# Patient Record
Sex: Female | Born: 1985 | Race: White | Hispanic: No | Marital: Married | Smoking: Never smoker
Health system: Southern US, Community
[De-identification: ages and names within clinical notes are randomized; demographics above are authoritative.]

## PROBLEM LIST (undated history)

## (undated) DIAGNOSIS — R87629 Unspecified abnormal cytological findings in specimens from vagina: Secondary | ICD-10-CM

## (undated) HISTORY — DX: Unspecified abnormal cytological findings in specimens from vagina: R87.629

---
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
## 2014-11-02 ENCOUNTER — Emergency Department (HOSPITAL_COMMUNITY): Payer: BLUE CROSS/BLUE SHIELD

## 2014-11-02 ENCOUNTER — Encounter (HOSPITAL_COMMUNITY): Payer: Self-pay

## 2014-11-02 ENCOUNTER — Emergency Department (HOSPITAL_COMMUNITY)
Admission: EM | Admit: 2014-11-02 | Discharge: 2014-11-02 | Disposition: A | Discharge status: Home / Self Care | Payer: BLUE CROSS/BLUE SHIELD | Attending: Emergency Medicine | Admitting: Emergency Medicine

## 2014-11-02 DIAGNOSIS — R42 Dizziness and giddiness: Secondary | ICD-10-CM | POA: Diagnosis present

## 2014-11-02 DIAGNOSIS — Z3202 Encounter for pregnancy test, result negative: Secondary | ICD-10-CM | POA: Diagnosis not present

## 2014-11-02 DIAGNOSIS — H538 Other visual disturbances: Secondary | ICD-10-CM | POA: Insufficient documentation

## 2014-11-02 DIAGNOSIS — R519 Headache, unspecified: Secondary | ICD-10-CM

## 2014-11-02 DIAGNOSIS — R51 Headache: Secondary | ICD-10-CM | POA: Diagnosis not present

## 2014-11-02 LAB — I-STAT BETA HCG BLOOD, ED (MC, WL, AP ONLY): I-stat hCG, quantitative: <5 m[IU]/mL (ref <5)

## 2014-11-02 LAB — URINE MICROSCOPIC-ADD ON

## 2014-11-02 LAB — URINALYSIS, ROUTINE W REFLEX MICROSCOPIC
Bilirubin Urine: NEGATIVE
Glucose, UA: NEGATIVE mg/dL
Hgb urine dipstick: NEGATIVE
Ketones, ur: NEGATIVE mg/dL
Nitrite: NEGATIVE
Protein, ur: NEGATIVE mg/dL
Specific Gravity, Urine: 1.003 — ABNORMAL LOW (ref 1.005–1.030)
Urobilinogen, UA: 0.2 mg/dL (ref 0.0–1.0)
pH: 6.5 (ref 5.0–8.0)

## 2014-11-02 LAB — BASIC METABOLIC PANEL
Anion gap: 8 (ref 5–15)
BUN: 10 mg/dL (ref 6–20)
CO2: 23 mmol/L (ref 22–32)
Calcium: 9.4 mg/dL (ref 8.9–10.3)
Chloride: 106 mmol/L (ref 101–111)
Creatinine, Ser: 0.71 mg/dL (ref 0.44–1.00)
GFR calc Af Amer: >60 mL/min (ref >60)
GFR calc non Af Amer: >60 mL/min (ref >60)
Glucose, Bld: 102 mg/dL — ABNORMAL HIGH (ref 65–99)
Potassium: 3.9 mmol/L (ref 3.5–5.1)
Sodium: 137 mmol/L (ref 135–145)

## 2014-11-02 LAB — CBC WITH DIFFERENTIAL/PLATELET
Basophils Absolute: 0 10*3/uL (ref 0.0–0.1)
Basophils Relative: 0 %
Eosinophils Absolute: 0.1 10*3/uL (ref 0.0–0.7)
Eosinophils Relative: 1 %
HCT: 41.8 % (ref 36.0–46.0)
Hemoglobin: 14.6 g/dL (ref 12.0–15.0)
Lymphocytes Relative: 18 %
Lymphs Abs: 1.5 10*3/uL (ref 0.7–4.0)
MCH: 32.4 pg (ref 26.0–34.0)
MCHC: 34.9 g/dL (ref 30.0–36.0)
MCV: 92.7 fL (ref 78.0–100.0)
Monocytes Absolute: 0.4 10*3/uL (ref 0.1–1.0)
Monocytes Relative: 5 %
Neutro Abs: 6.4 10*3/uL (ref 1.7–7.7)
Neutrophils Relative %: 76 %
Platelets: 209 10*3/uL (ref 150–400)
RBC: 4.51 MIL/uL (ref 3.87–5.11)
RDW: 11.9 % (ref 11.5–15.5)
WBC: 8.4 10*3/uL (ref 4.0–10.5)

## 2014-11-02 NOTE — Discharge Instructions (Signed)
Headaches, Frequently Asked Questions  MIGRAINE HEADACHES  Q: What is migraine? What causes it? How can I treat it?  A: Generally, migraine headaches begin as a dull ache. Then they develop into a constant, throbbing, and pulsating pain. You may experience pain at the temples. You may experience pain at the front or back of one or both sides of the head. The pain is usually accompanied by a combination of:  · Nausea.  · Vomiting.  · Sensitivity to light and noise.  Some people (about 15%) experience an aura (see below) before an attack. The cause of migraine is believed to be chemical reactions in the brain. Treatment for migraine may include over-the-counter or prescription medications. It may also include self-help techniques. These include relaxation training and biofeedback.   Q: What is an aura?  A: About 15% of people with migraine get an "aura". This is a sign of neurological symptoms that occur before a migraine headache. You may see wavy or jagged lines, dots, or flashing lights. You might experience tunnel vision or blind spots in one or both eyes. The aura can include visual or auditory hallucinations (something imagined). It may include disruptions in smell (such as strange odors), taste or touch. Other symptoms include:  · Numbness.  · A "pins and needles" sensation.  · Difficulty in recalling or speaking the correct word.  These neurological events may last as long as 60 minutes. These symptoms will fade as the headache begins.  Q: What is a trigger?  A: Certain physical or environmental factors can lead to or "trigger" a migraine. These include:  · Foods.  · Hormonal changes.  · Weather.  · Stress.  It is important to remember that triggers are different for everyone. To help prevent migraine attacks, you need to figure out which triggers affect you. Keep a headache diary. This is a good way to track triggers. The diary will help you talk to your healthcare professional about your condition.  Q: Does  weather affect migraines?  A: Bright sunshine, hot, humid conditions, and drastic changes in barometric pressure may lead to, or "trigger," a migraine attack in some people. But studies have shown that weather does not act as a trigger for everyone with migraines.  Q: What is the link between migraine and hormones?  A: Hormones start and regulate many of your body's functions. Hormones keep your body in balance within a constantly changing environment. The levels of hormones in your body are unbalanced at times. Examples are during menstruation, pregnancy, or menopause. That can lead to a migraine attack. In fact, about three quarters of all women with migraine report that their attacks are related to the menstrual cycle.   Q: Is there an increased risk of stroke for migraine sufferers?  A: The likelihood of a migraine attack causing a stroke is very remote. That is not to say that migraine sufferers cannot have a stroke associated with their migraines. In persons under age 40, the most common associated factor for stroke is migraine headache. But over the course of a person's normal life span, the occurrence of migraine headache may actually be associated with a reduced risk of dying from cerebrovascular disease due to stroke.   Q: What are acute medications for migraine?  A: Acute medications are used to treat the pain of the headache after it has started. Examples over-the-counter medications, NSAIDs, ergots, and triptans.   Q: What are the triptans?  A: Triptans are the newest class of   abortive medications. They are specifically targeted to treat migraine. Triptans are vasoconstrictors. They moderate some chemical reactions in the brain. The triptans work on receptors in your brain. Triptans help to restore the balance of a neurotransmitter called serotonin. Fluctuations in levels of serotonin are thought to be a main cause of migraine.   Q: Are over-the-counter medications for migraine effective?  A:  Over-the-counter, or "OTC," medications may be effective in relieving mild to moderate pain and associated symptoms of migraine. But you should see your caregiver before beginning any treatment regimen for migraine.   Q: What are preventive medications for migraine?  A: Preventive medications for migraine are sometimes referred to as "prophylactic" treatments. They are used to reduce the frequency, severity, and length of migraine attacks. Examples of preventive medications include antiepileptic medications, antidepressants, beta-blockers, calcium channel blockers, and NSAIDs (nonsteroidal anti-inflammatory drugs).  Q: Why are anticonvulsants used to treat migraine?  A: During the past few years, there has been an increased interest in antiepileptic drugs for the prevention of migraine. They are sometimes referred to as "anticonvulsants". Both epilepsy and migraine may be caused by similar reactions in the brain.   Q: Why are antidepressants used to treat migraine?  A: Antidepressants are typically used to treat people with depression. They may reduce migraine frequency by regulating chemical levels, such as serotonin, in the brain.   Q: What alternative therapies are used to treat migraine?  A: The term "alternative therapies" is often used to describe treatments considered outside the scope of conventional Western medicine. Examples of alternative therapy include acupuncture, acupressure, and yoga. Another common alternative treatment is herbal therapy. Some herbs are believed to relieve headache pain. Always discuss alternative therapies with your caregiver before proceeding. Some herbal products contain arsenic and other toxins.  TENSION HEADACHES  Q: What is a tension-type headache? What causes it? How can I treat it?  A: Tension-type headaches occur randomly. They are often the result of temporary stress, anxiety, fatigue, or anger. Symptoms include soreness in your temples, a tightening band-like sensation  around your head (a "vice-like" ache). Symptoms can also include a pulling feeling, pressure sensations, and contracting head and neck muscles. The headache begins in your forehead, temples, or the back of your head and neck. Treatment for tension-type headache may include over-the-counter or prescription medications. Treatment may also include self-help techniques such as relaxation training and biofeedback.  CLUSTER HEADACHES  Q: What is a cluster headache? What causes it? How can I treat it?  A: Cluster headache gets its name because the attacks come in groups. The pain arrives with little, if any, warning. It is usually on one side of the head. A tearing or bloodshot eye and a runny nose on the same side of the headache may also accompany the pain. Cluster headaches are believed to be caused by chemical reactions in the brain. They have been described as the most severe and intense of any headache type. Treatment for cluster headache includes prescription medication and oxygen.  SINUS HEADACHES  Q: What is a sinus headache? What causes it? How can I treat it?  A: When a cavity in the bones of the face and skull (a sinus) becomes inflamed, the inflammation will cause localized pain. This condition is usually the result of an allergic reaction, a tumor, or an infection. If your headache is caused by a sinus blockage, such as an infection, you will probably have a fever. An x-ray will confirm a sinus blockage. Your caregiver's   treatment might include antibiotics for the infection, as well as antihistamines or decongestants.   REBOUND HEADACHES  Q: What is a rebound headache? What causes it? How can I treat it?  A: A pattern of taking acute headache medications too often can lead to a condition known as "rebound headache." A pattern of taking too much headache medication includes taking it more than 2 days per week or in excessive amounts. That means more than the label or a caregiver advises. With rebound  headaches, your medications not only stop relieving pain, they actually begin to cause headaches. Doctors treat rebound headache by tapering the medication that is being overused. Sometimes your caregiver will gradually substitute a different type of treatment or medication. Stopping may be a challenge. Regularly overusing a medication increases the potential for serious side effects. Consult a caregiver if you regularly use headache medications more than 2 days per week or more than the label advises.  ADDITIONAL QUESTIONS AND ANSWERS  Q: What is biofeedback?  A: Biofeedback is a self-help treatment. Biofeedback uses special equipment to monitor your body's involuntary physical responses. Biofeedback monitors:  · Breathing.  · Pulse.  · Heart rate.  · Temperature.  · Muscle tension.  · Brain activity.  Biofeedback helps you refine and perfect your relaxation exercises. You learn to control the physical responses that are related to stress. Once the technique has been mastered, you do not need the equipment any more.  Q: Are headaches hereditary?  A: Four out of five (80%) of people that suffer report a family history of migraine. Scientists are not sure if this is genetic or a family predisposition. Despite the uncertainty, a child has a 50% chance of having migraine if one parent suffers. The child has a 75% chance if both parents suffer.   Q: Can children get headaches?  A: By the time they reach high school, most young people have experienced some type of headache. Many safe and effective approaches or medications can prevent a headache from occurring or stop it after it has begun.   Q: What type of doctor should I see to diagnose and treat my headache?  A: Start with your primary caregiver. Discuss his or her experience and approach to headaches. Discuss methods of classification, diagnosis, and treatment. Your caregiver may decide to recommend you to a headache specialist, depending upon your symptoms or other  physical conditions. Having diabetes, allergies, etc., may require a more comprehensive and inclusive approach to your headache. The National [employer] will provide, upon request, a list of NHF physician members in your state.  Document Released: 04/27/2003 Document Revised: 04/29/2011 Document Reviewed: 10/05/2007  ExitCare® Patient Information ©2015 [employer]. This information is not intended to replace advice given to you by your health care provider. Make sure you discuss any questions you have with your health care provider.      Migraine Headache  A migraine headache is very bad, throbbing pain on one or both sides of your head. Talk to your doctor about what things may bring on (trigger) your migraine headaches.  HOME CARE  · Only take medicines as told by your doctor.  · Lie down in a dark, quiet room when you have a migraine.  · Keep a journal to find out if certain things bring on migraine headaches. For example, write down:  ¨ What you eat and drink.  ¨ How much sleep you get.  ¨ Any change to your diet or medicines.  ·   Lessen how much alcohol you drink.  · Quit smoking if you smoke.  · Get enough sleep.  · Lessen any stress in your life.  · Keep lights dim if bright lights bother you or make your migraines worse.  GET HELP RIGHT AWAY IF:   · Your migraine becomes really bad.  · You have a fever.  · You have a stiff neck.  · You have trouble seeing.  · Your muscles are weak, or you lose muscle control.  · You lose your balance or have trouble walking.  · You feel like you will pass out (faint), or you pass out.  · You have really bad symptoms that are different than your first symptoms.  MAKE SURE YOU:   · Understand these instructions.  · Will watch your condition.  · Will get help right away if you are not doing well or get worse.  Document Released: 11/14/2007 Document Revised: 04/29/2011 Document Reviewed: 10/12/2012  ExitCare® Patient Information ©2015 [employer]. This information is  not intended to replace advice given to you by your health care provider. Make sure you discuss any questions you have with your health care provider.

## 2014-11-02 NOTE — ED Provider Notes (Signed)
CSN: [id number]     Arrival date & time 11/02/14  4010 History   First MD Initiated Contact with Patient 11/02/14 [phone]     Chief Complaint  Patient presents with  . Dizziness  . Blurred Vision     (Consider location/radiation/quality/duration/timing/severity/associated sxs/prior Treatment) HPI Comments: Pt comes in with c/o vision abnormality in her right eye that lasted a couple of minutes. She states that she had some fussiness in the center or her vision and then she saw some flashing light. She states that the whole time she could she it was just abnormal. She took out her contact. The symptoms resolved on there own. She was seen by the nurse at work and they told her to come to the er. She states that she is also having some bilateral leg heaviness. Denies slurred speech, confusion. Admits to headache. No recent injury. Hasn't taken any medication  The history is provided by the patient. No language interpreter was used.    History reviewed. No pertinent past medical history. History reviewed. No pertinent past surgical history. History reviewed. No pertinent family history. Social History  Substance Use Topics  . Smoking status: Never Smoker   . Smokeless tobacco: None  . Alcohol Use: Yes     Comment: Social   OB History    No data available     Review of Systems  All other systems reviewed and are negative.     Allergies  Hydrocodone-acetaminophen  Home Medications   Prior to Admission medications   Not on File   BP 144/87 mmHg  Pulse 108  Temp(Src) 98.4 F (36.9 C) (Oral)  Resp 16  SpO2 100%  LMP 10/18/2014 Physical Exam  Constitutional: She is oriented to person, place, and time. She appears well-developed and well-nourished.  HENT:  Head: Normocephalic and atraumatic.  Right Ear: External ear normal.  Left Ear: External ear normal.  Eyes: Conjunctivae and EOM are normal. Pupils are equal, round, and reactive to light.  Neck: Normal range of motion.  Neck supple.  Cardiovascular: Normal rate and regular rhythm.   Pulmonary/Chest: Effort normal and breath sounds normal.  Abdominal: Soft. There is no tenderness.  Musculoskeletal: Normal range of motion.  Neurological: She is alert and oriented to person, place, and time. She exhibits normal muscle tone. Coordination normal.  Pt not negative finger to nose. No pronator drift. Ambulatory without any problem  Skin: Skin is warm and dry.  Psychiatric: She has a normal mood and affect.  Nursing note and vitals reviewed.   ED Course  Procedures (including critical care time) Labs Review Labs Reviewed  BASIC METABOLIC PANEL - Abnormal; Notable for the following:    Glucose, Bld 102 (*)    All other components within normal limits  URINALYSIS, ROUTINE W REFLEX MICROSCOPIC (NOT AT [facility]) - Abnormal; Notable for the following:    Color, Urine STRAW (*)    Specific Gravity, Urine 1.003 (*)    Leukocytes, UA TRACE (*)    All other components within normal limits  URINE MICROSCOPIC-ADD ON - Abnormal; Notable for the following:    Bacteria, UA FEW (*)    All other components within normal limits  CBC WITH DIFFERENTIAL/PLATELET  I-STAT BETA HCG BLOOD, ED ([facility], [facility], [facility] ONLY)    Imaging Review Ct Head Wo Contrast  11/02/2014   CLINICAL DATA:  Sudden onset of blurred vision today with headache. Initial encounter.  EXAM: CT HEAD WITHOUT CONTRAST  TECHNIQUE: Contiguous axial images were obtained from the  base of the skull through the vertex without intravenous contrast.  COMPARISON:  None.  FINDINGS: There is no evidence of acute intracranial hemorrhage, mass lesion, brain edema or extra-axial fluid collection. The ventricles and subarachnoid spaces are appropriately sized for age. There is no CT evidence of acute cortical infarction.  The visualized paranasal sinuses, mastoid air cells and middle ears are clear. The calvarium is intact.  IMPRESSION: Unremarkable noncontrast head CT.   Electronically  Signed   By: Carey Bullocks M.D.   On: 11/02/2014 10:58   I have personally reviewed and evaluated these images and lab results as part of my medical decision-making.   EKG Interpretation None      MDM   Final diagnoses:  Headache, unspecified headache type    Symptoms have continued to be resolved minus the headache. Pt is refusing medications. She states that she will just take medications when she gets some. Discussed return precautions with pt.     Teressa Lower, NP 11/02/14 1155  Laurence Spates, MD 11/02/14 1233

## 2014-11-02 NOTE — ED Notes (Signed)
PA at bedside.

## 2014-11-02 NOTE — ED Notes (Signed)
Pt reports she initially noticed blurry vision when she left her house for work this morning. Pt reports she felt a weakness bilaterally in her legs while walking. Pt is alert and oriented X4. Pt denies slight headache.

## 2018-07-30 LAB — OB RESULTS CONSOLE HIV ANTIBODY (ROUTINE TESTING): HIV: NONREACTIVE

## 2018-07-30 LAB — OB RESULTS CONSOLE VARICELLA ZOSTER ANTIBODY, IGG: Varicella: IMMUNE

## 2018-07-30 LAB — OB RESULTS CONSOLE GC/CHLAMYDIA
Chlamydia: NEGATIVE
Gonorrhea: NEGATIVE

## 2018-07-30 LAB — OB RESULTS CONSOLE HEPATITIS B SURFACE ANTIGEN: Hepatitis B Surface Ag: NEGATIVE

## 2018-07-30 LAB — OB RESULTS CONSOLE RUBELLA ANTIBODY, IGM: Rubella: NON-IMMUNE/NOT IMMUNE

## 2019-02-02 LAB — OB RESULTS CONSOLE GBS: GBS: NEGATIVE

## 2019-02-19 NOTE — L&D Delivery Note (Signed)
Delivery Note:   G1P0 at [redacted]w[redacted]d  Admitting diagnosis: Pregnancy [Z34.90] Risks: late term Onset of labor: 2230 03/06/2019 Augmentation: AROM forebag ROM: 0841 03/07/2019  Complete dilation at 03/07/2019  1446 Onset of pushing at 1446 FHR second stage Cat 1 and 2, variables  Analgesia /Anesthesia intrapartum:Epidural  Pushing in L and R lateral tilt position with CNM and L&D staff support, FOB Ree Kida present for birth and supportive.  Delivery of a Live born female  Birth Weight:  pending APGAR: 9, 9  Newborn Delivery   Birth date/time: 03/07/2019 16:10:00 Delivery type: Vaginal, Spontaneous      in cephalic presentation, position OA to LOT. Body assisted by FOB and placed on mother's chest. Strong cry at delivery, moderate meconium and terminal meconium noted.   Nuchal Cord: No  Cord double clamped after cessation of pulsation, cut by FOB.  Collection of cord blood for typing completed. Cord blood donation-None  Arterial cord blood sample-No    Placenta delivered-Spontaneous  with 3 vessels . Trailing membranes. Uterotonics: Pitocin Placenta to L&D for disposal, heavy mec stain not recommended for encapsulation. Marland Kitchen Uterine tone firm, bleeding small  1st degree  laceration identified.  Episiotomy:None  Local analgesia: none  Repair: 3.0 vicryl in standard fashion Est. Blood Loss (mL):200.00   Complications: None  Mom to postpartum.  Baby Duke to Couplet care / Skin to Skin.  Delivery Report:  Review the Delivery Report for details.     Signed: Neta Mends, CNM, MSN 03/07/2019, 4:37 PM

## 2019-02-26 ENCOUNTER — Inpatient Hospital Stay (HOSPITAL_COMMUNITY): Admission: AD | Admit: 2019-02-26 | Payer: 59 | Source: Home / Self Care | Admitting: Obstetrics and Gynecology

## 2019-03-07 ENCOUNTER — Inpatient Hospital Stay (HOSPITAL_COMMUNITY): Payer: No Typology Code available for payment source | Admitting: Anesthesiology

## 2019-03-07 ENCOUNTER — Inpatient Hospital Stay (HOSPITAL_COMMUNITY)
Admission: AD | Admit: 2019-03-07 | Discharge: 2019-03-09 | DRG: 806 | Disposition: A | Discharge status: Home / Self Care | Payer: No Typology Code available for payment source

## 2019-03-07 ENCOUNTER — Other Ambulatory Visit: Payer: Self-pay

## 2019-03-07 ENCOUNTER — Encounter (HOSPITAL_COMMUNITY): Payer: Self-pay

## 2019-03-07 DIAGNOSIS — Z20822 Contact with and (suspected) exposure to covid-19: Secondary | ICD-10-CM | POA: Diagnosis present

## 2019-03-07 DIAGNOSIS — Z3490 Encounter for supervision of normal pregnancy, unspecified, unspecified trimester: Secondary | ICD-10-CM

## 2019-03-07 DIAGNOSIS — O480 Post-term pregnancy: Secondary | ICD-10-CM | POA: Diagnosis present

## 2019-03-07 DIAGNOSIS — D62 Acute posthemorrhagic anemia: Secondary | ICD-10-CM | POA: Diagnosis not present

## 2019-03-07 DIAGNOSIS — O9081 Anemia of the puerperium: Secondary | ICD-10-CM | POA: Diagnosis not present

## 2019-03-07 DIAGNOSIS — Z3A41 41 weeks gestation of pregnancy: Secondary | ICD-10-CM | POA: Diagnosis not present

## 2019-03-07 LAB — RESPIRATORY PANEL BY RT PCR (FLU A&B, COVID)
Influenza A by PCR: NEGATIVE
Influenza B by PCR: NEGATIVE
SARS Coronavirus 2 by RT PCR: NEGATIVE

## 2019-03-07 LAB — CBC
HCT: 35 % — ABNORMAL LOW (ref 36.0–46.0)
Hemoglobin: 12.1 g/dL (ref 12.0–15.0)
MCH: 31.9 pg (ref 26.0–34.0)
MCHC: 34.6 g/dL (ref 30.0–36.0)
MCV: 92.3 fL (ref 80.0–100.0)
Platelets: 181 K/uL (ref 150–400)
RBC: 3.79 MIL/uL — ABNORMAL LOW (ref 3.87–5.11)
RDW: 12.9 % (ref 11.5–15.5)
WBC: 14 K/uL — ABNORMAL HIGH (ref 4.0–10.5)
nRBC: 0 % (ref 0.0–0.2)

## 2019-03-07 LAB — RPR: RPR Ser Ql: NONREACTIVE

## 2019-03-07 LAB — TYPE AND SCREEN
ABO/RH(D): A POS
Antibody Screen: NEGATIVE

## 2019-03-07 LAB — ABO/RH: ABO/RH(D): A POS

## 2019-03-07 MED ORDER — OXYTOCIN 40 UNITS IN NORMAL SALINE INFUSION - SIMPLE MED
INTRAVENOUS | Status: AC
  Filled 2019-03-07: qty 1000

## 2019-03-07 MED ORDER — DIBUCAINE (PERIANAL) 1 % EX OINT: 1.0000 "application " | TOPICAL_OINTMENT | CUTANEOUS | Status: DC | PRN

## 2019-03-07 MED ORDER — SOD CITRATE-CITRIC ACID 500-334 MG/5ML PO SOLN
30.0000 mL | ORAL | Status: DC | PRN
  Administered 2019-03-07 (×2): 30 mL via ORAL
  Filled 2019-03-07 (×2): qty 30

## 2019-03-07 MED ORDER — BENZOCAINE-MENTHOL 20-0.5 % EX AERO
1.0000 "application " | INHALATION_SPRAY | CUTANEOUS | Status: DC | PRN
  Filled 2019-03-07: qty 56

## 2019-03-07 MED ORDER — PHENYLEPHRINE 40 MCG/ML (10ML) SYRINGE FOR IV PUSH (FOR BLOOD PRESSURE SUPPORT)
80.0000 ug | PREFILLED_SYRINGE | INTRAVENOUS | Status: DC | PRN
  Filled 2019-03-07: qty 10

## 2019-03-07 MED ORDER — FLEET ENEMA 7-19 GM/118ML RE ENEM: 1.0000 | ENEMA | Freq: Every day | RECTAL | Status: DC | PRN

## 2019-03-07 MED ORDER — LACTATED RINGERS IV SOLN: 500.0000 mL | Freq: Once | INTRAVENOUS | Status: DC

## 2019-03-07 MED ORDER — FENTANYL-BUPIVACAINE-NACL 0.5-0.125-0.9 MG/250ML-% EP SOLN
12.0000 mL/h | EPIDURAL | Status: DC | PRN
  Filled 2019-03-07: qty 250

## 2019-03-07 MED ORDER — LIDOCAINE HCL (PF) 1 % IJ SOLN
INTRAMUSCULAR | Status: DC | PRN
  Administered 2019-03-07 (×2): 4 mL via EPIDURAL

## 2019-03-07 MED ORDER — ZOLPIDEM TARTRATE 5 MG PO TABS: 5.0000 mg | ORAL_TABLET | Freq: Every evening | ORAL | Status: DC | PRN

## 2019-03-07 MED ORDER — LIDOCAINE HCL (PF) 1 % IJ SOLN: 30.0000 mL | INTRAMUSCULAR | Status: DC | PRN

## 2019-03-07 MED ORDER — EPHEDRINE 5 MG/ML INJ: 10.0000 mg | INTRAVENOUS | Status: DC | PRN

## 2019-03-07 MED ORDER — SENNOSIDES-DOCUSATE SODIUM 8.6-50 MG PO TABS
2.0000 | ORAL_TABLET | ORAL | Status: DC
  Filled 2019-03-07: qty 2

## 2019-03-07 MED ORDER — PHENYLEPHRINE 40 MCG/ML (10ML) SYRINGE FOR IV PUSH (FOR BLOOD PRESSURE SUPPORT)
80.0000 ug | PREFILLED_SYRINGE | INTRAVENOUS | Status: DC | PRN
  Administered 2019-03-07: 80 ug via INTRAVENOUS

## 2019-03-07 MED ORDER — LACTATED RINGERS IV SOLN
500.0000 mL | Freq: Once | INTRAVENOUS | Status: AC
  Administered 2019-03-07: 1000 mL via INTRAVENOUS

## 2019-03-07 MED ORDER — DIPHENHYDRAMINE HCL 50 MG/ML IJ SOLN: 12.5000 mg | INTRAMUSCULAR | Status: DC | PRN

## 2019-03-07 MED ORDER — ONDANSETRON HCL 4 MG/2ML IJ SOLN: 4.0000 mg | Freq: Four times a day (QID) | INTRAMUSCULAR | Status: DC | PRN

## 2019-03-07 MED ORDER — PRENATAL MULTIVITAMIN CH
1.0000 | ORAL_TABLET | Freq: Every day | ORAL | Status: DC
  Administered 2019-03-08 – 2019-03-09 (×2): 1 via ORAL
  Filled 2019-03-07 (×2): qty 1

## 2019-03-07 MED ORDER — SIMETHICONE 80 MG PO CHEW: 80.0000 mg | CHEWABLE_TABLET | ORAL | Status: DC | PRN

## 2019-03-07 MED ORDER — BISACODYL 10 MG RE SUPP: 10.0000 mg | Freq: Every day | RECTAL | Status: DC | PRN

## 2019-03-07 MED ORDER — TETANUS-DIPHTH-ACELL PERTUSSIS 5-2.5-18.5 LF-MCG/0.5 IM SUSP: 0.5000 mL | Freq: Once | INTRAMUSCULAR | Status: DC

## 2019-03-07 MED ORDER — SODIUM CHLORIDE (PF) 0.9 % IJ SOLN
INTRAMUSCULAR | Status: DC | PRN
  Administered 2019-03-07: 12 mL/h via EPIDURAL

## 2019-03-07 MED ORDER — ACETAMINOPHEN 325 MG PO TABS: 650.0000 mg | ORAL_TABLET | ORAL | Status: DC | PRN

## 2019-03-07 MED ORDER — DIPHENHYDRAMINE HCL 25 MG PO CAPS: 25.0000 mg | ORAL_CAPSULE | Freq: Four times a day (QID) | ORAL | Status: DC | PRN

## 2019-03-07 MED ORDER — ONDANSETRON HCL 4 MG PO TABS: 4.0000 mg | ORAL_TABLET | ORAL | Status: DC | PRN

## 2019-03-07 MED ORDER — IBUPROFEN 600 MG PO TABS
600.0000 mg | ORAL_TABLET | Freq: Four times a day (QID) | ORAL | Status: DC
  Administered 2019-03-07 – 2019-03-09 (×7): 600 mg via ORAL
  Filled 2019-03-07 (×7): qty 1

## 2019-03-07 MED ORDER — ONDANSETRON HCL 4 MG/2ML IJ SOLN: 4.0000 mg | INTRAMUSCULAR | Status: DC | PRN

## 2019-03-07 MED ORDER — COCONUT OIL OIL: 1.0000 "application " | TOPICAL_OIL | Status: DC | PRN

## 2019-03-07 MED ORDER — WITCH HAZEL-GLYCERIN EX PADS: 1.0000 "application " | MEDICATED_PAD | CUTANEOUS | Status: DC | PRN

## 2019-03-07 NOTE — Plan of Care (Signed)
  Problem: Education: Goal: Knowledge of Childbirth will improve 03/07/2019 1735 by Susanne Greenhouse, RN Outcome: Completed/Met 03/07/2019 1129 by Susanne Greenhouse, RN Outcome: Progressing Goal: Ability to make informed decisions regarding treatment and plan of care will improve 03/07/2019 1735 by Susanne Greenhouse, RN Outcome: Completed/Met 03/07/2019 1129 by Susanne Greenhouse, RN Outcome: Progressing Goal: Ability to state and carry out methods to decrease the pain will improve 03/07/2019 1735 by Susanne Greenhouse, RN Outcome: Completed/Met 03/07/2019 1129 by Susanne Greenhouse, RN Outcome: Progressing Goal: Individualized Educational Video(s) 03/07/2019 1735 by Susanne Greenhouse, RN Outcome: Completed/Met 03/07/2019 1129 by Susanne Greenhouse, RN Outcome: Progressing   Problem: Coping: Goal: Ability to verbalize concerns and feelings about labor and delivery will improve 03/07/2019 1735 by Susanne Greenhouse, RN Outcome: Completed/Met 03/07/2019 1129 by Susanne Greenhouse, RN Outcome: Progressing   Problem: Life Cycle: Goal: Ability to make normal progression through stages of labor will improve 03/07/2019 1735 by Susanne Greenhouse, RN Outcome: Completed/Met 03/07/2019 1129 by Susanne Greenhouse, RN Outcome: Progressing Goal: Ability to effectively push during vaginal delivery will improve 03/07/2019 1735 by Susanne Greenhouse, RN Outcome: Completed/Met 03/07/2019 1129 by Susanne Greenhouse, RN Outcome: Progressing   Problem: Role Relationship: Goal: Will demonstrate positive interactions with the child 03/07/2019 1735 by Susanne Greenhouse, RN Outcome: Completed/Met 03/07/2019 1129 by Susanne Greenhouse, RN Outcome: Progressing   Problem: Safety: Goal: Risk of complications during labor and delivery will decrease 03/07/2019 1735 by Susanne Greenhouse, RN Outcome: Completed/Met 03/07/2019 1129 by Susanne Greenhouse, RN Outcome: Progressing   Problem: Pain Management: Goal: Relief or control of pain  from uterine contractions will improve 03/07/2019 1735 by Susanne Greenhouse, RN Outcome: Completed/Met 03/07/2019 1129 by Susanne Greenhouse, RN Outcome: Progressing   Problem: Pain Managment: Goal: General experience of comfort will improve 03/07/2019 1735 by Susanne Greenhouse, RN Outcome: Completed/Met 03/07/2019 1129 by Susanne Greenhouse, RN Outcome: Progressing   Problem: Safety: Goal: Ability to remain free from injury will improve 03/07/2019 1735 by Susanne Greenhouse, RN Outcome: Completed/Met 03/07/2019 1129 by Susanne Greenhouse, RN Outcome: Progressing

## 2019-03-07 NOTE — MAU Note (Signed)
Patient presents to MAU c/o ctx. +FM, denies LOF. Reports bloody show

## 2019-03-07 NOTE — Anesthesia Preprocedure Evaluation (Signed)
Anesthesia Evaluation  Patient identified by MRN, date of birth, ID band Patient awake    Reviewed: Allergy & Precautions, Patient's Chart, lab work & pertinent test results  History of Anesthesia Complications Negative for: history of anesthetic complications  Airway Mallampati: II  TM Distance: >3 FB Neck ROM: Full    Dental no notable dental hx.    Pulmonary neg pulmonary ROS,    Pulmonary exam normal        Cardiovascular negative cardio ROS Normal cardiovascular exam     Neuro/Psych negative neurological ROS  negative psych ROS   GI/Hepatic negative GI ROS, Neg liver ROS,   Endo/Other  negative endocrine ROS  Renal/GU negative Renal ROS  negative genitourinary   Musculoskeletal negative musculoskeletal ROS (+)   Abdominal   Peds  Hematology negative hematology ROS (+)   Anesthesia Other Findings Day of surgery medications reviewed with patient.  Reproductive/Obstetrics (+) Pregnancy                             Anesthesia Physical Anesthesia Plan  ASA: II  Anesthesia Plan: Epidural   Post-op Pain Management:    Induction:   PONV Risk Score and Plan: Treatment may vary due to age or medical condition  Airway Management Planned: Natural Airway  Additional Equipment:   Intra-op Plan:   Post-operative Plan:   Informed Consent: I have reviewed the patients History and Physical, chart, labs and discussed the procedure including the risks, benefits and alternatives for the proposed anesthesia with the patient or authorized representative who has indicated his/her understanding and acceptance.       Plan Discussed with:   Anesthesia Plan Comments:         Anesthesia Quick Evaluation  

## 2019-03-07 NOTE — Plan of Care (Signed)
  Problem: Education: Goal: Knowledge of Childbirth will improve Outcome: Progressing Goal: Ability to make informed decisions regarding treatment and plan of care will improve Outcome: Progressing Goal: Ability to state and carry out methods to decrease the pain will improve Outcome: Progressing Goal: Individualized Educational Video(s) Outcome: Progressing   Problem: Coping: Goal: Ability to verbalize concerns and feelings about labor and delivery will improve Outcome: Progressing   Problem: Life Cycle: Goal: Ability to make normal progression through stages of labor will improve Outcome: Progressing Goal: Ability to effectively push during vaginal delivery will improve Outcome: Progressing   Problem: Role Relationship: Goal: Will demonstrate positive interactions with the child Outcome: Progressing   Problem: Safety: Goal: Risk of complications during labor and delivery will decrease Outcome: Progressing   Problem: Pain Management: Goal: Relief or control of pain from uterine contractions will improve Outcome: Progressing   Problem: Pain Managment: Goal: General experience of comfort will improve Outcome: Progressing   Problem: Safety: Goal: Ability to remain free from injury will improve Outcome: Progressing

## 2019-03-07 NOTE — Progress Notes (Signed)
Patient ID: Amanda Elliott, female   DOB: Jul 01, 1985, 34 y.o.   MRN: [id number] S: Doing well, pain controlled with  Epidural, pelvic pressure intermittent. Opted for epidural after cervix unchanged from 7 cm over several hours.    O: Vitals:   03/07/19 1310 03/07/19 1315 03/07/19 1320 03/07/19 1325  BP: 110/68 120/71 107/62 120/78  Pulse: 100 97 90 96  Resp: 16 16 16 16   Temp:      TempSrc:      Weight:      Height:         FHT:  FHR: 140 bpm, variability: moderate,  accelerations:  Present,  decelerations:  Present prolonged decel right after epidural resolved with fluids bolus and phenylephrine UC:   regular, every 2-3 minutes SVE:   Dilation: Lip/rim Effacement (%): 80 Station: Plus 2 Exam by:: D Artemis Koller CNM  Lip reducible with ctx.  A / P: Protracted active phase Progress after epidural and pelvic relaxation Will continue with position changes / peanut ball Fetal Wellbeing:  Category II  post epidural hypotensive episode, resolved, now Cat 1 with early decels Pain Control:  Epidural  Anticipated MOD:  NSVD  [phone], CNM, MSN 03/07/2019, 1:34 PM

## 2019-03-07 NOTE — H&P (Signed)
OB ADMISSION/ HISTORY & PHYSICAL:  Admission Date: 03/07/2019  3:11 AM  Admit Diagnosis: Pregnancy [Z34.90]    Amanda Elliott is a 34 y.o. female presenting for painful ctx started last evening. Denies LOF, reports + spotting, + FM, denies PEC sx. Desires natural labor, using doula and spouse for support.  Prenatal History: G1P0   EDC : 02/26/2019, by Last Menstrual Period  Prenatal care at [facility] since 31 wks, TOC from [facility], desires CNM care, hydrotherapy in labor.   Prenatal course complicated by: none  Prenatal Labs: ABO, Rh:   A pos Antibody: NEG (01/17 0348) Rubella:   Immune RPR:   NR HBsAg:   neg HIV:   neg GBS: Negative/-- (12/15 0000)  1 hr Glucola : 106 Genetic Screening: normal 1T screen, declined AFP1 Ultrasound: normal XY anatomy, anterior placenta Growth 40.5 wks EFW 8'10" (87%), AFI 9    Maternal Diabetes: No Genetic Screening: Normal Maternal Ultrasounds/Referrals: Normal Fetal Ultrasounds or other Referrals:  None Maternal Substance Abuse:  No Significant Maternal Medications:  None Significant Maternal Lab Results:  Group B Strep negative Other Comments:  None  Medical / Surgical History :  Past medical history: History reviewed. No pertinent past medical history.   Past surgical history: History reviewed. No pertinent surgical history.   Family History: History reviewed. No pertinent family history.   Social History:  reports that she has never smoked. She has never used smokeless tobacco. She reports that she does not drink alcohol or use drugs.   Allergies: Vicodin [hydrocodone-acetaminophen]   Current Medications at time of admission:  Medications Prior to Admission  Medication Sig Dispense Refill Last Dose  . Prenatal Vit-Fe Fumarate-FA (MULTIVITAMIN-PRENATAL) 27-0.8 MG TABS tablet Take 1 tablet by mouth daily at 12 noon.   03/06/2019 at Unknown time     Review of Systems: ROS  Physical Exam: Vital  signs and nursing notes reviewed.  ED Triage Vitals  Enc Vitals Group     BP 03/07/19 0333 124/85     Pulse Rate 03/07/19 0333 94     Resp 03/07/19 0552 18     Temp 03/07/19 0332 97.6 F (36.4 C)     Temp Source 03/07/19 0415 Oral     SpO2 --      Weight 03/07/19 0412 142 lb 14.4 oz (64.8 kg)     Height 03/07/19 0412 5\' 2"  (1.575 m)     Head Circumference --      Peak Flow --      Pain Score 03/07/19 0329 7     Pain Loc --      Pain Edu? --      Excl. in [place]? --      General: AAO x 3, NAD, coping very well Heart: RRR Lungs:CTAB Abdomen: Gravid, NT, Leopold's vertex, fetal spine to maternal L Extremities: no edema Genitalia / VE: Dilation: 4 Effacement (%): 80 Station: -1, -2 Presentation: Vertex Exam by:: Denyse Dago, RN  Clear AF on pad mixed with bloody show, SROM 0700 FHR: 135 BPM, mod variability, + accels, x 1 variable resolved with repositioning TOCO: Ctx q3-5 min, stronger since SROM  Labs:   Pending T&S, CBC, RPR  Recent Labs    03/07/19 0348  WBC 14.0*  HGB 12.1  HCT 35.0*  PLT 181       Assessment:  34 y.o. G1P0 at [redacted]w[redacted]d  1. Early stage of labor 2. FHR category 1 3. GBS neg 4. Desires unmedicated birth  5. Breastfeeding 6. Placenta disposal , plans encapsulation  Plan:  1. Admit to BS 2. Routine L&D orders 3. Analgesia/anesthesia PRN  4. Expectant management 5. Anticipate NSVB   Dr Amado Nash notified of admission / plan of care   Neta Mends CNM, MSN 03/07/2019, 7:31 AM

## 2019-03-07 NOTE — Progress Notes (Signed)
Amanda Elliott is a 34 y.o. G1P0 at [redacted]w[redacted]d by ultrasound admitted for active labor  Subjective: Working w/ ctx, using HK position. In shower earlier for hydrotherapy. Getting a bit discouraged.   Objective: BP 114/68   Pulse 95   Temp 98.3 F (36.8 C) (Oral)   Resp 18   Ht 5\' 2"  (1.575 m)   Wt 64.8 kg   LMP 05/22/2018   BMI 26.14 kg/m  No intake/output data recorded. No intake/output data recorded.  FHT:  FHR: 130 bpm, variability: moderate,  accelerations:  Present,  decelerations:  Present early UC:   regular, every 3 min minutes SVE:   6/90/-1, vtx, LOA Forebag ruptured for moderate meconium  Labs: Lab Results  Component Value Date   WBC 14.0 (H) 03/07/2019   HGB 12.1 03/07/2019   HCT 35.0 (L) 03/07/2019   MCV 92.3 03/07/2019   PLT 181 03/07/2019    Assessment / Plan: Spontaneous labor, progressing normally  Labor: Progressing normally and augmented with AROM of forebag Preeclampsia:  no signs or symptoms of toxicity Fetal Wellbeing:  Category I Pain Control:  Labor support without medications I/D:  GBS neg Anticipated MOD:  NSVD  03/09/2019 03/07/2019, 8:44 AM

## 2019-03-07 NOTE — Lactation Note (Signed)
This note was copied from a baby's chart. Lactation Consultation Note  Patient Name: Amanda Elliott DXAJO'I Date: 03/07/2019 Reason for consult: Initial assessment;Primapara;1st time breastfeeding;Term  Mom was resting in bed and FOB was holding baby "Duke" STS when [facility] entered the room. Mom politely declined LC assistance and stated that she would like to continue to rest this evening and be seen by lactation tomorrow.   Mom stated that breastfeeding has been going well thus far. She reported (+) breast changes and colostrum leakage throughout her pregnancy. Mom also shared that she took a breastfeeding education class through her doula provider.   LC encouraged mom and FOB to feed Duke 8-12x within a 24 hour period. LC also encouraged mom to call RN if they need assistance latching Duke to the breast.   LC follow-up is needed to discuss breastfeeding basics, output, newborn behavior, and OP services.     Maternal Data Formula Feeding for Exclusion: No Has patient been taught Hand Expression?: No Does the patient have breastfeeding experience prior to this delivery?: No  Feeding Feeding Type: Breast Fed   Consult Status Consult Status: Follow-up Date: 03/08/19 Follow-up type: In-patient    Walker Shadow 03/07/2019, 9:59 PM

## 2019-03-07 NOTE — Anesthesia Procedure Notes (Signed)
Epidural Patient location during procedure: OB Start time: 03/07/2019 12:41 PM End time: 03/07/2019 12:44 PM  Staffing Anesthesiologist: Kaylyn Layer, MD Performed: anesthesiologist   Preanesthetic Checklist Completed: patient identified, IV checked, risks and benefits discussed, monitors and equipment checked, pre-op evaluation and timeout performed  Epidural Patient position: sitting Prep: DuraPrep and site prepped and draped Patient monitoring: continuous pulse ox, blood pressure and heart rate Approach: midline Location: L3-L4 Injection technique: LOR air  Needle:  Needle type: Tuohy  Needle gauge: 17 G Needle length: 9 cm Needle insertion depth: 5 cm Catheter type: closed end flexible Catheter size: 19 Gauge Catheter at skin depth: 10 cm Test dose: negative and Other (1% lidocaine)  Assessment Events: blood not aspirated, injection not painful, no injection resistance, no paresthesia and negative IV test  Additional Notes Patient identified. Risks, benefits, and alternatives discussed with patient including but not limited to bleeding, infection, nerve damage, paralysis, failed block, incomplete pain control, headache, blood pressure changes, nausea, vomiting, reactions to medication, itching, and postpartum back pain. Confirmed with bedside nurse the patient's most recent platelet count. Confirmed with patient that they are not currently taking any anticoagulation, have any bleeding history, or any family history of bleeding disorders. Patient expressed understanding and wished to proceed. All questions were answered. Sterile technique was used throughout the entire procedure. Please see nursing notes for vital signs.   Crisp LOR on first pass. Test dose was given through epidural catheter and negative prior to continuing to dose epidural or start infusion. Warning signs of high block given to the patient including shortness of breath, tingling/numbness in hands, complete  motor block, or any concerning symptoms with instructions to call for help. Patient was given instructions on fall risk and not to get out of bed. All questions and concerns addressed with instructions to call with any issues or inadequate analgesia.  Reason for block:procedure for pain

## 2019-03-08 LAB — CBC
HCT: 30.4 % — ABNORMAL LOW (ref 36.0–46.0)
Hemoglobin: 10.3 g/dL — ABNORMAL LOW (ref 12.0–15.0)
MCH: 31.8 pg (ref 26.0–34.0)
MCHC: 33.9 g/dL (ref 30.0–36.0)
MCV: 93.8 fL (ref 80.0–100.0)
Platelets: 148 K/uL — ABNORMAL LOW (ref 150–400)
RBC: 3.24 MIL/uL — ABNORMAL LOW (ref 3.87–5.11)
RDW: 13.1 % (ref 11.5–15.5)
WBC: 14.9 K/uL — ABNORMAL HIGH (ref 4.0–10.5)
nRBC: 0 % (ref 0.0–0.2)

## 2019-03-08 MED ORDER — IBUPROFEN 600 MG PO TABS: 600.0000 mg | ORAL_TABLET | Freq: Four times a day (QID) | ORAL | 0 refills | Status: AC

## 2019-03-08 MED ORDER — MAGNESIUM OXIDE 400 (241.3 MG) MG PO TABS
400.0000 mg | ORAL_TABLET | Freq: Every day | ORAL | Status: DC
  Administered 2019-03-09: 400 mg via ORAL
  Filled 2019-03-08: qty 1

## 2019-03-08 MED ORDER — POLYSACCHARIDE IRON COMPLEX 150 MG PO CAPS
150.0000 mg | ORAL_CAPSULE | Freq: Every day | ORAL | Status: DC
  Administered 2019-03-09: 150 mg via ORAL
  Filled 2019-03-08: qty 1

## 2019-03-08 MED ORDER — POLYSACCHARIDE IRON COMPLEX 150 MG PO CAPS: 150.0000 mg | ORAL_CAPSULE | Freq: Every day | ORAL | 3 refills | Status: DC

## 2019-03-08 MED ORDER — MEASLES, MUMPS & RUBELLA VAC IJ SOLR
0.5000 mL | Freq: Once | INTRAMUSCULAR | Status: AC
  Administered 2019-03-09: 0.5 mL via SUBCUTANEOUS
  Filled 2019-03-08: qty 0.5

## 2019-03-08 MED ORDER — MAGNESIUM OXIDE 400 (241.3 MG) MG PO TABS: 400.0000 mg | ORAL_TABLET | Freq: Every day | ORAL | 0 refills | Status: AC

## 2019-03-08 NOTE — Discharge Instructions (Signed)
Sitz baths 2 times /day with warm water x 1 week May add herbals: 1 ounce dried comfrey leaf* 1 ounce calendula flowers 1 ounce lavender flowers 1/2 ounce dried uva ursi leaves 1/2 ounce witch hazel blossoms (if you can find them) 1/2 ounce dried sage leaf 1/2 cup sea salt Directions: Bring 2 quarts of water to a boil. Turn off heat, and place 1 ounce (approximately 1 large handful) of the above mixed herbs (not the salt) into the pot. Steep, covered, for 30 minutes.  Strain the liquid well with a fine mesh strainer, and discard the herb material. Add 2 quarts of liquid to the tub, along with the 1/2 cup of salt. This medicinal liquid can also be made into compresses and peri-rinses. 

## 2019-03-08 NOTE — Anesthesia Postprocedure Evaluation (Signed)
Anesthesia Post Note  Patient: Derricka Mertz Wadle  Procedure(s) Performed: AN AD HOC LABOR EPIDURAL     Patient location during evaluation: Mother Baby Anesthesia Type: Epidural Level of consciousness: awake and alert and oriented Pain management: satisfactory to patient Vital Signs Assessment: post-procedure vital signs reviewed and stable Respiratory status: spontaneous breathing and nonlabored ventilation Cardiovascular status: stable Postop Assessment: no headache, no backache, no signs of nausea or vomiting, adequate PO intake, patient able to bend at knees and able to ambulate (patient up walking) Anesthetic complications: no    Last Vitals:  Vitals:   03/08/19 0330 03/08/19 0729  BP: 113/80 121/75  Pulse: 80 88  Resp: 16 18  Temp: 36.7 C 36.9 C  SpO2: 100% 99%    Last Pain:  Vitals:   03/08/19 0729  TempSrc: Oral  PainSc:    Pain Goal:                Epidural/Spinal Function Cutaneous sensation: Normal sensation (03/08/19 0728)  Madison Hickman

## 2019-03-08 NOTE — Progress Notes (Signed)
PPD #1, SVD, 1st degree perineal, baby boy "Duke"  S:  Reports feeling overall well, but sore. C/o tailbone pain. Requesting early d/c home at 24 hours, which would be around 4:10pm - waiting to hear from peds.              Tolerating po/ No nausea or vomiting / Denies dizziness or SOB             Bleeding is moderate             Pain controlled with Motrin             Up ad lib / ambulatory / voiding QS  Newborn breast feeding - going okay, reports baby has been spitting up some amniotic fluid, but has latched several times. Would like to see lactation again today  / Circumcision - completed  O:               VS: BP 121/75 (BP Location: Left Arm)   Pulse 88   Temp 98.4 F (36.9 C) (Oral)   Resp 18   Ht _0  (1.575 m)   Wt 64.8 kg   LMP 05/22/2018   SpO2 99%   Breastfeeding Unknown   BMI 26.14 kg/m    LABS:              Recent Labs    03/07/19 0348 03/08/19 0455  WBC 14.0* 14.9*  HGB 12.1 10.3*  PLT 181 148*               Blood type: --/--/A POS, A POS Performed at [facility] [address], [place], [place] [address]  [phone])  Rubella: Nonimmune (06/11 0000)                     I&O: Intake/Output      01/17 0701 - 01/18 0700 01/18 0701 - 01/19 0700   Urine (mL/kg/hr) 900 (0.6)    Blood 200    Total Output 1100    Net -1100                       Physical Exam:             Alert and oriented X3  Lungs: Clear and unlabored  Heart: regular rate and rhythm / no murmurs  Abdomen: soft, non-tender, non-distended              Fundus: firm, non-tender, U-1  Perineum: well approximated first degree repair, (+) labial edema and erythema noted  Lochia: small rubra noted on pad   Extremities: no edema, no calf pain or tenderness    A/P: PPD # 1, SVD  1st degree perineal   - warm water sitz baths PRN  Rubella non-immune    - offer MMR prior to d/c  ABL Anemia    - Niferex 155m PO daily   - Magnesium oxide 4026mPO daily    Doing well - stable  status  Routine post partum orders  Discharge mom today if baby stable for d/c  WOB discharge book given, instructions and warning s/s reviewed   F/u with CNMs in 2 weeks   MeLars PinksMSN, [facility]

## 2019-03-09 MED ORDER — BENZOCAINE-MENTHOL 20-0.5 % EX AERO: 1.0000 "application " | INHALATION_SPRAY | CUTANEOUS | Status: DC | PRN

## 2019-03-09 MED ORDER — COCONUT OIL OIL: 1.0000 "application " | TOPICAL_OIL | 0 refills | Status: DC | PRN

## 2019-03-09 MED ORDER — ACETAMINOPHEN 500 MG PO TABS: 1000.0000 mg | ORAL_TABLET | Freq: Four times a day (QID) | ORAL | 2 refills | Status: AC | PRN

## 2019-03-09 NOTE — Lactation Note (Signed)
This note was copied from a baby's chart. Lactation Consultation Note Attempted to see mom but was sleeping.  Patient Name: Amanda Elliott MCEYE'M Date: 03/09/2019     Maternal Data    Feeding    LATCH Score                   Interventions    Lactation Tools Discussed/Used     Consult Status      Charyl Dancer 03/09/2019, 1:52 AM

## 2019-03-09 NOTE — Discharge Summary (Signed)
Obstetric Discharge Summary Reason for Admission: onset of labor Prenatal Procedures: ultrasound Intrapartum Procedures: spontaneous vaginal delivery and epidural Postpartum Procedures: Rubella Ig Complications-Operative and Postpartum: 1st degree perineal laceration Hemoglobin  Date Value Ref Range Status  03/08/2019 10.3 (L) 12.0 - 15.0 g/dL Final   HCT  Date Value Ref Range Status  03/08/2019 30.4 (L) 36.0 - 46.0 % Final    Physical Exam:  General: alert, cooperative and no distress Lochia: appropriate Uterine Fundus: firm Incision: healing well DVT Evaluation: No cords or calf tenderness. No significant calf/ankle edema.  Discharge Diagnoses: Principal Problem:   Postpartum care following vaginal delivery 1/17 Active Problems:   Pregnancy   SVD (spontaneous vaginal delivery)   Perineal laceration with delivery, first degree    Discharge Information: Date: 03/09/2019 Activity: pelvic rest Diet: routine Medications:  Allergies as of 03/09/2019      Reactions   Vicodin [hydrocodone-acetaminophen]    Pt states that she can tolerate acetaminophen      Medication List    TAKE these medications   acetaminophen 500 MG tablet Commonly known as: TYLENOL Take 2 tablets (1,000 mg total) by mouth every 6 (six) hours as needed.   benzocaine-Menthol 20-0.5 % Aero Commonly known as: DERMOPLAST Apply 1 application topically as needed for irritation (perineal discomfort).   coconut oil Oil Apply 1 application topically as needed.   ibuprofen 600 MG tablet Commonly known as: ADVIL Take 1 tablet (600 mg total) by mouth every 6 (six) hours.   iron polysaccharides 150 MG capsule Commonly known as: NIFEREX Take 1 capsule (150 mg total) by mouth daily.   magnesium oxide 400 (241.3 Mg) MG tablet Commonly known as: MAG-OX Take 1 tablet (400 mg total) by mouth daily.   multivitamin-prenatal 27-0.8 MG Tabs tablet Take 1 tablet by mouth daily at 12 noon.             Discharge Care Instructions  (From admission, onward)         Start     Ordered   03/09/19 0000  Discharge wound care:    Comments: Sitz baths 2 times /day with warm water x 1 week. May add herbals: 1 ounce dried comfrey leaf* 1 ounce calendula flowers 1 ounce lavender flowers 1/2 ounce dried uva ursi leaves 1/2 ounce witch hazel blossoms (if you can find them) 1/2 ounce dried sage leaf 1/2 cup sea salt Directions: Bring 2 quarts of water to a boil. Turn off heat, and place 1 ounce (approximately 1 large handful) of the above mixed herbs (not the salt) into the pot. Steep, covered, for 30 minutes.  Strain the liquid well with a fine mesh strainer, and discard the herb material. Add 2 quarts of liquid to the tub, along with the 1/2 cup of salt. This medicinal liquid can also be made into compresses and peri-rinses.   03/09/19 1003         Condition: stable Instructions: refer to practice specific booklet Discharge to: home Follow-up Information    Neta Mends, CNM. Schedule an appointment as soon as possible for a visit in 2 week(s).   Specialty: Obstetrics and Gynecology Why: F/u Contact information: [address] [place] [address] [phone]           Newborn Data: Live born female Koren Bound Birth Weight: 7 lb (3175 g) APGAR: 9, 9  Newborn Delivery   Birth date/time: 03/07/2019 16:10:00 Delivery type: Vaginal, Spontaneous      Home with mother.  Neta Mends,  CNM 03/09/2019, 10:04 AM

## 2019-03-09 NOTE — Lactation Note (Signed)
This note was copied from a baby's chart. Lactation Consultation Note  Patient Name: Amanda Elliott AESLP'N Date: 03/09/2019 Reason for consult: Follow-up assessment Baby is 41 hours old/7% weight loss.  Voids and stools WNL.  Mom feels feedings arte going well although baby prefers left breast.  She can get baby to latch to right but it takes longer.  She is trying different positions.  Nipples are erect but right nipple is shorter.  Hand pump given with instructions to pre pump prior to latching.  Discussed milk coming to volume and the prevention and treatment of engorgement.  Mom has a pump for home use.  Answered pumping questions and discussed introducing a bottle.  Recommended keeping a feeding diary the first week.  Reviewed outpatient services and encouraged to call prn.  Maternal Data    Feeding Feeding Type: Breast Fed  LATCH Score                   Interventions Interventions: Hand pump  Lactation Tools Discussed/Used     Consult Status Consult Status: Complete Follow-up type: Call as needed    Huston Foley 03/09/2019, 9:36 AM

## 2019-05-18 ENCOUNTER — Other Ambulatory Visit: Payer: Self-pay

## 2019-05-18 DIAGNOSIS — N6310 Unspecified lump in the right breast, unspecified quadrant: Secondary | ICD-10-CM

## 2019-06-09 ENCOUNTER — Other Ambulatory Visit: Payer: Self-pay

## 2019-06-09 ENCOUNTER — Ambulatory Visit
Admission: RE | Admit: 2019-06-09 | Discharge: 2019-06-09 | Disposition: A | Discharge status: Home / Self Care | Payer: 59 | Source: Ambulatory Visit

## 2019-06-09 DIAGNOSIS — N6310 Unspecified lump in the right breast, unspecified quadrant: Secondary | ICD-10-CM

## 2019-06-09 DIAGNOSIS — N6320 Unspecified lump in the left breast, unspecified quadrant: Secondary | ICD-10-CM

## 2019-06-24 ENCOUNTER — Emergency Department (HOSPITAL_COMMUNITY)
Admission: EM | Admit: 2019-06-24 | Discharge: 2019-06-24 | Disposition: A | Discharge status: Home / Self Care | Payer: 59 | Attending: Emergency Medicine | Admitting: Emergency Medicine

## 2019-06-24 ENCOUNTER — Emergency Department (HOSPITAL_COMMUNITY): Payer: 59

## 2019-06-24 ENCOUNTER — Other Ambulatory Visit: Payer: Self-pay

## 2019-06-24 ENCOUNTER — Encounter (HOSPITAL_COMMUNITY): Payer: Self-pay | Admitting: Emergency Medicine

## 2019-06-24 ENCOUNTER — Telehealth: Payer: Self-pay | Admitting: Nurse Practitioner

## 2019-06-24 DIAGNOSIS — J029 Acute pharyngitis, unspecified: Secondary | ICD-10-CM | POA: Diagnosis present

## 2019-06-24 DIAGNOSIS — U071 COVID-19: Secondary | ICD-10-CM | POA: Insufficient documentation

## 2019-06-24 DIAGNOSIS — Z79899 Other long term (current) drug therapy: Secondary | ICD-10-CM | POA: Insufficient documentation

## 2019-06-24 LAB — COMPREHENSIVE METABOLIC PANEL WITH GFR
ALT: 15 U/L (ref 0–44)
AST: 19 U/L (ref 15–41)
Albumin: 3.9 g/dL (ref 3.5–5.0)
Alkaline Phosphatase: 41 U/L (ref 38–126)
Anion gap: 9 (ref 5–15)
BUN: 8 mg/dL (ref 6–20)
CO2: 27 mmol/L (ref 22–32)
Calcium: 8.5 mg/dL — ABNORMAL LOW (ref 8.9–10.3)
Chloride: 104 mmol/L (ref 98–111)
Creatinine, Ser: 0.55 mg/dL (ref 0.44–1.00)
GFR calc Af Amer: >60 mL/min (ref >60)
GFR calc non Af Amer: >60 mL/min (ref >60)
Glucose, Bld: 94 mg/dL (ref 70–99)
Potassium: 3.8 mmol/L (ref 3.5–5.1)
Sodium: 140 mmol/L (ref 135–145)
Total Bilirubin: 0.3 mg/dL (ref 0.3–1.2)
Total Protein: 7.3 g/dL (ref 6.5–8.1)

## 2019-06-24 LAB — CBC WITH DIFFERENTIAL/PLATELET
Abs Immature Granulocytes: 0.01 K/uL (ref 0.00–0.07)
Basophils Absolute: 0 K/uL (ref 0.0–0.1)
Basophils Relative: 0 %
Eosinophils Absolute: 0.1 K/uL (ref 0.0–0.5)
Eosinophils Relative: 1 %
HCT: 41.4 % (ref 36.0–46.0)
Hemoglobin: 13.6 g/dL (ref 12.0–15.0)
Immature Granulocytes: 0 %
Lymphocytes Relative: 38 %
Lymphs Abs: 1.8 K/uL (ref 0.7–4.0)
MCH: 30.4 pg (ref 26.0–34.0)
MCHC: 32.9 g/dL (ref 30.0–36.0)
MCV: 92.6 fL (ref 80.0–100.0)
Monocytes Absolute: 0.5 K/uL (ref 0.1–1.0)
Monocytes Relative: 10 %
Neutro Abs: 2.4 K/uL (ref 1.7–7.7)
Neutrophils Relative %: 51 %
Platelets: 238 K/uL (ref 150–400)
RBC: 4.47 MIL/uL (ref 3.87–5.11)
RDW: 12 % (ref 11.5–15.5)
WBC: 4.8 K/uL (ref 4.0–10.5)
nRBC: 0 % (ref 0.0–0.2)

## 2019-06-24 MED ORDER — PREDNISONE 20 MG PO TABS
60.0000 mg | ORAL_TABLET | Freq: Once | ORAL | Status: AC
  Administered 2019-06-24: 60 mg via ORAL
  Filled 2019-06-24: qty 3

## 2019-06-24 MED ORDER — NAPROXEN 500 MG PO TABS
500.0000 mg | ORAL_TABLET | Freq: Two times a day (BID) | ORAL | 0 refills | Status: AC
Start: 2019-06-24 — End: 2019-07-04

## 2019-06-24 MED ORDER — PREDNISONE 50 MG PO TABS
50.0000 mg | ORAL_TABLET | Freq: Every day | ORAL | 0 refills | Status: DC
Start: 2019-06-24 — End: 2021-06-26

## 2019-06-24 NOTE — Telephone Encounter (Signed)
Called to discuss with Huston Foley Birnie about Covid symptoms and the use of bamlanivimab combination, a monoclonal antibody infusion for those with mild to moderate Covid symptoms and at a high risk of hospitalization.     Pt is not qualified for this infusion due to lack of identified risk factors and co-morbid conditions.  Patient is currently being treated in the ED for symptoms. She reports she is feeling much better already.Patient verbalized awareness and understanding that she is not a candidate for the monoclonal infusion.   Patient Active Problem List   Diagnosis Date Noted  . Pregnancy 03/07/2019  . SVD (spontaneous vaginal delivery) 03/07/2019  . Postpartum care following vaginal delivery 1/17 03/07/2019  . Perineal laceration with delivery, first degree 03/07/2019    Willette Alma, AGPCNP-BC Pager: [phone] Amion: Thea Alken

## 2019-06-24 NOTE — Discharge Instructions (Addendum)
Please continue to maintain isolation precautions.  Continue taking your over-the-counter medications for symptomatic relief.    I have prescribed you naproxen.  Please discontinue all other NSAIDs.  Also discontinue should you become pregnant or breast-feeding.  I know you have an infant at home, NSAIDs are not recommended during breast-feeding.  I have also prescribed you a course of prednisone.  Please take in the morning, as written.  This should also help with your sore throat symptoms.  I encourage you to consider warm tea with honey and over-the-counter Chloraseptic spray for your sore throat symptoms, as well.  Throat lozenges also help.  Laboratory work-up today was very reassuring.  Please return to the ED or seek immediate medical attention should you experience any new or worsening symptoms.

## 2019-06-24 NOTE — ED Notes (Signed)
On 06/19/19, pt said she started feeling sick. She reports sore throat and chest pain from coughing. The last time she had a fever was last night. A physician friend recommended she go to ED for medication to lessen her symptoms.

## 2019-06-24 NOTE — ED Provider Notes (Signed)
[facility] DEPT Provider Note   CSN: [id number] Arrival date & time: 06/24/19  0841     History No chief complaint on file.   Amanda Elliott is a 34 y.o. female recently diagnosed with COVID-19 presents to the ED with complaints of sore throat.  Patient reports that she was diagnosed with COVID-19 yesterday, 06/23/2019.  She first became symptomatic 06/19/2019 and states that her symptoms have been progressively worse.  She notes that her T-max was 53 F, however has since improved.  She has not had a fever since yesterday.  She is taking ibuprofen 400 mg every 8 hours.  She has also been taking over-the-counter medications for her cough symptoms.  She has been endorsing mild chest discomfort, but only with her cough.  No exertional or pleuritic chest discomfort.  She notes a dry, scratchy throat with associated change in voice.  She denies any dizziness, inability to eat or drink, wheezing or stridor, shortness of breath, difficulty breathing, drooling, trismus, tongue swelling, pleuritic or exertional chest discomfort, pleuritic cough, history of clots, clotting disorder, recent surgery or immobilization, unilateral extremity swelling, abdominal pain, nausea or vomiting, or other symptoms.  No significant past medical history.    HPI     History reviewed. No pertinent past medical history.  Patient Active Problem List   Diagnosis Date Noted  . Pregnancy 03/07/2019  . SVD (spontaneous vaginal delivery) 03/07/2019  . Postpartum care following vaginal delivery 1/17 03/07/2019  . Perineal laceration with delivery, first degree 03/07/2019    History reviewed. No pertinent surgical history.   OB History    Gravida  1   Para  1   Term  1   Preterm      AB      Living  1     SAB      TAB      Ectopic      Multiple  0   Live Births  1           Family History  Problem Relation Age of Onset  . Breast cancer Paternal Grandmother      Social History   Tobacco Use  . Smoking status: Never Smoker  . Smokeless tobacco: Never Used  Substance Use Topics  . Alcohol use: Never  . Drug use: Never    Home Medications Prior to Admission medications   Medication Sig Start Date End Date Taking? Authorizing Provider  acetaminophen (TYLENOL) 500 MG tablet Take 2 tablets (1,000 mg total) by mouth every 6 (six) hours as needed. 03/09/19 03/08/20 Yes Juliene Pina, CNM  Docosahexaenoic Acid (DHA PO) Take 2 capsules by mouth daily.   Yes [provider]  ibuprofen (ADVIL) 600 MG tablet Take 1 tablet (600 mg total) by mouth every 6 (six) hours. 03/08/19  Yes Sigmon, Tyler Deis, CNM  Prenatal Vit-Fe Fumarate-FA (PRENATAL MULTIVITAMIN) TABS tablet Take 1 tablet by mouth daily at 12 noon.   Yes [provider]  Probiotic CAPS Take 1 capsule by mouth daily.   Yes [provider]  benzocaine-Menthol (DERMOPLAST) 20-0.5 % AERO Apply 1 application topically as needed for irritation (perineal discomfort). Patient not taking: Reported on 06/24/2019 03/09/19   Juliene Pina, CNM  coconut oil OIL Apply 1 application topically as needed. Patient not taking: Reported on 06/24/2019 03/09/19   Juliene Pina, CNM  iron polysaccharides (NIFEREX) 150 MG capsule Take 1 capsule (150 mg total) by mouth daily. Patient not taking: Reported on  06/24/2019 03/08/19   Karena Addison, CNM  magnesium oxide (MAG-OX) 400 (241.3 Mg) MG tablet Take 1 tablet (400 mg total) by mouth daily. Patient not taking: Reported on 06/24/2019 03/08/19   Karena Addison, CNM  naproxen (NAPROSYN) 500 MG tablet Take 1 tablet (500 mg total) by mouth 2 (two) times daily for 10 days. 06/24/19 07/04/19  Lorelee New, PA-C  predniSONE (DELTASONE) 50 MG tablet Take 1 tablet (50 mg total) by mouth daily. 06/24/19   Lorelee New, PA-C    Allergies    Vicodin [hydrocodone-acetaminophen]  Review of Systems   Review of Systems  All other systems reviewed  and are negative.   Physical Exam Updated Vital Signs BP (!) 81/68   Pulse 82   Temp 98.4 F (36.9 C) (Oral)   Resp 14   Ht 5\' 2"  (1.575 m)   Wt 56.7 kg   LMP 06/21/2019   SpO2 99%   BMI 22.86 kg/m   Physical Exam Vitals and nursing note reviewed. Exam conducted with a chaperone present.  Constitutional:      Appearance: Normal appearance.  HENT:     Head: Normocephalic and atraumatic.     Mouth/Throat:     Comments: Patent oropharynx.  No tonsil hypertrophy or exudates noted.  No uvular deviation.  Tolerating secretions well.  No trismus. Eyes:     General: No scleral icterus.    Conjunctiva/sclera: Conjunctivae normal.  Cardiovascular:     Rate and Rhythm: Normal rate and regular rhythm.     Pulses: Normal pulses.     Heart sounds: Normal heart sounds.  Pulmonary:     Effort: Pulmonary effort is normal. No respiratory distress.     Breath sounds: Normal breath sounds. No wheezing or rales.  Musculoskeletal:     Cervical back: Normal range of motion and neck supple. No rigidity.  Skin:    General: Skin is dry.     Capillary Refill: Capillary refill takes less than 2 seconds.  Neurological:     Mental Status: She is alert and oriented to person, place, and time.     GCS: GCS eye subscore is 4. GCS verbal subscore is 5. GCS motor subscore is 6.  Psychiatric:        Mood and Affect: Mood normal.        Behavior: Behavior normal.        Thought Content: Thought content normal.     ED Results / Procedures / Treatments   Labs (all labs ordered are listed, but only abnormal results are displayed) Labs Reviewed  COMPREHENSIVE METABOLIC PANEL - Abnormal; Notable for the following components:      Result Value   Calcium 8.5 (*)    All other components within normal limits  CBC WITH DIFFERENTIAL/PLATELET    EKG None  Radiology DG Chest Portable 1 View  Result Date: 06/24/2019 CLINICAL DATA:  On 06/19/19, pt said she started feeling sick. She reports sore throat  and chest pain from coughing x 3 days. The last time she had a fever was last night. A physician friend recommended she go to ED for medication to lessen her symptoms. Not taking HTN meds. Not diabetic. Nonsmoker. No hx of asthma. EXAM: PORTABLE CHEST 1 VIEW COMPRISON: None. FINDINGS: Cardiac silhouette is normal in size. No mediastinal hilar masses. No evidence of adenopathy. Clear lungs.  No pleural effusion or pneumothorax. Skeletal structures are unremarkable. IMPRESSION: Normal frontal chest radiograph. Electronically Signed   By: 08/19/19  Ormond M.D.   On: 06/24/2019 12:20    Procedures Procedures (including critical care time)  Medications Ordered in ED Medications  predniSONE (DELTASONE) tablet 60 mg (60 mg Oral Given 06/24/19 1124)    ED Course  I have reviewed the triage vital signs and the nursing notes.  Pertinent labs & imaging results that were available during my care of the patient were reviewed by me and considered in my medical decision making (see chart for details).    MDM Rules/Calculators/A&P                      Patient presents the ED with sore throat symptoms in context of a COVID-19 infection.  Patient's oral pharyngeal exam was reassuring, low suspicion for PTA/RPA/strep pharyngitis.  Patient has already been taking NSAIDs, so provided 60 mg prednisone here in the ED.  On subsequent evaluation, patient is feeling improved.  Given her reported chest discomfort as well as diminished appetite due to COVID-19, obtain basic laboratory work-up.  CBC with differential and CMP were obtained and are unremarkable.  No electrolyte derangement.  No anemia.  EKG is personally reviewed and demonstrates normal sinus rhythm without any evidence of ischemia.  Do not feel troponin is warranted as patient states that her chest comfort is almost purely associated with her cough.  No pleuritic symptoms.  No exertional symptoms.  I also personally reviewed plain films obtained of chest which  demonstrated no acute cardiopulmonary findings.  Will provide 4-day prednisone burst and other more conservative management for sore throat symptoms.  ARMENTHA BRANAGAN was evaluated in Emergency Department on 06/24/2019 for the symptoms described in the history of present illness. She was evaluated in the context of the global COVID-19 pandemic, which necessitated consideration that the patient might be at risk for infection with the SARS-CoV-2 virus that causes COVID-19. Institutional protocols and algorithms that pertain to the evaluation of patients at risk for COVID-19 are in a state of rapid change based on information released by regulatory bodies including the CDC and federal and state organizations. These policies and algorithms were followed during the patient's care in the ED.  Strict ED return precautions discussed with the patient.  All of the evaluation and work-up results were discussed with the patient and any family at bedside. They were provided opportunity to ask any additional questions and have none at this time. They have expressed understanding of verbal discharge instructions as well as return precautions and are agreeable to the plan.    Final Clinical Impression(s) / ED Diagnoses Final diagnoses:  COVID-19  Pharyngitis, unspecified etiology    Rx / DC Orders ED Discharge Orders         Ordered    predniSONE (DELTASONE) 50 MG tablet  Daily     06/24/19 1316    naproxen (NAPROSYN) 500 MG tablet  2 times daily     06/24/19 1316           Elvera Maria 06/24/19 1316    Mancel Bale, MD 06/24/19 1750

## 2019-12-13 ENCOUNTER — Other Ambulatory Visit: Payer: 59

## 2021-06-20 ENCOUNTER — Emergency Department (HOSPITAL_COMMUNITY)
Admission: EM | Admit: 2021-06-20 | Discharge: 2021-06-20 | Disposition: A | Discharge status: Home / Self Care | Payer: No Typology Code available for payment source | Attending: Emergency Medicine | Admitting: Emergency Medicine

## 2021-06-20 ENCOUNTER — Emergency Department (HOSPITAL_COMMUNITY): Payer: No Typology Code available for payment source

## 2021-06-20 DIAGNOSIS — R0789 Other chest pain: Secondary | ICD-10-CM | POA: Insufficient documentation

## 2021-06-20 DIAGNOSIS — R072 Precordial pain: Secondary | ICD-10-CM | POA: Diagnosis present

## 2021-06-20 DIAGNOSIS — R002 Palpitations: Secondary | ICD-10-CM

## 2021-06-20 DIAGNOSIS — I493 Ventricular premature depolarization: Secondary | ICD-10-CM | POA: Diagnosis not present

## 2021-06-20 LAB — TROPONIN I (HIGH SENSITIVITY)
Troponin I (High Sensitivity): <2 ng/L (ref <18)
Troponin I (High Sensitivity): <2 ng/L (ref <18)

## 2021-06-20 LAB — BASIC METABOLIC PANEL WITH GFR
Anion gap: 9 (ref 5–15)
BUN: 12 mg/dL (ref 6–20)
CO2: 25 mmol/L (ref 22–32)
Calcium: 9.2 mg/dL (ref 8.9–10.3)
Chloride: 105 mmol/L (ref 98–111)
Creatinine, Ser: 0.6 mg/dL (ref 0.44–1.00)
GFR, Estimated: >60 mL/min (ref >60)
Glucose, Bld: 89 mg/dL (ref 70–99)
Potassium: 3.8 mmol/L (ref 3.5–5.1)
Sodium: 139 mmol/L (ref 135–145)

## 2021-06-20 LAB — HEPATIC FUNCTION PANEL
ALT: 11 U/L (ref 0–44)
AST: 16 U/L (ref 15–41)
Albumin: 4.2 g/dL (ref 3.5–5.0)
Alkaline Phosphatase: 44 U/L (ref 38–126)
Bilirubin, Direct: <0.1 mg/dL (ref 0.0–0.2)
Total Bilirubin: 0.6 mg/dL (ref 0.3–1.2)
Total Protein: 7.5 g/dL (ref 6.5–8.1)

## 2021-06-20 LAB — TSH: TSH: 1.371 u[IU]/mL (ref 0.350–4.500)

## 2021-06-20 LAB — D-DIMER, QUANTITATIVE: D-Dimer, Quant: <0.27 ug{FEU}/mL (ref 0.00–0.50)

## 2021-06-20 LAB — CBC
HCT: 41.1 % (ref 36.0–46.0)
Hemoglobin: 14 g/dL (ref 12.0–15.0)
MCH: 32.2 pg (ref 26.0–34.0)
MCHC: 34.1 g/dL (ref 30.0–36.0)
MCV: 94.5 fL (ref 80.0–100.0)
Platelets: 231 K/uL (ref 150–400)
RBC: 4.35 MIL/uL (ref 3.87–5.11)
RDW: 11.7 % (ref 11.5–15.5)
WBC: 7.3 K/uL (ref 4.0–10.5)
nRBC: 0 % (ref 0.0–0.2)

## 2021-06-20 LAB — I-STAT BETA HCG BLOOD, ED (MC, WL, AP ONLY): I-stat hCG, quantitative: <5 m[IU]/mL (ref <5)

## 2021-06-20 LAB — MAGNESIUM: Magnesium: 2.1 mg/dL (ref 1.7–2.4)

## 2021-06-20 LAB — LIPASE, BLOOD: Lipase: 35 U/L (ref 11–51)

## 2021-06-20 NOTE — Discharge Instructions (Signed)
Your history, exam, work-up today was overall reassuring.  We did find evidence of the PVCs likely causing her palpitations.  The rest of the work-up did not show any concerning findings.  Given your stability for over 6 and half hours, we do feel you are safe for discharge home but please rest and stay hydrated and follow-up with outpatient cardiology.  If any symptoms change or worsen acutely, please return to the nearest emergency department.  ?

## 2021-06-20 NOTE — ED Provider Triage Note (Signed)
Emergency Medicine Provider Triage Evaluation Note ? ?Amanda Elliott , a 36 y.o. female  was evaluated in triage.  Pt complains of cp. Onset last Monday. ?She has associated palpitations. Comes and goes. Worse with anxiety. On Saturday she had an ocular migraine (spinning lights followed by a one sided headache) No Ha today. She has associated burning and pressure in chest. Felt like she might pass out, + diaphoresis ? ?Review of Systems  ?Positive: palpitations ?Negative: fever ? ?Physical Exam  ?BP 124/78 (BP Location: Left Arm)   Pulse 98   Temp 98.3 ?F (36.8 ?C) (Oral)   Resp 15   Ht 5\' 2"  (1.575 m)   Wt 53.1 kg   LMP 06/05/2021   SpO2 100%   BMI 21.40 kg/m?  ?Gen:   Awake, no distress   ?Resp:  Normal effort  ?MSK:   Moves extremities without difficulty  ?Other:  RRR ? ?Medical Decision Making  ?Medically screening exam initiated at 12:26 PM.  Appropriate orders placed.  Denelle Sifford Student was informed that the remainder of the evaluation will be completed by another provider, this initial triage assessment does not replace that evaluation, and the importance of remaining in the ED until their evaluation is complete. ? ?Work up initiated ?  ?Margarita Mail, PA-C ?06/20/21 1231 ? ?

## 2021-06-20 NOTE — ED Triage Notes (Signed)
Pt states she is having chest pain and palpitations for about 1 week. Pt describes the pain tightness and burning. ?

## 2021-06-20 NOTE — ED Provider Notes (Signed)
 [facility]-EMERGENCY DEPT Provider Note   CSN: [id number] Arrival date & time: 06/20/21  1116     History  Chief Complaint  Patient presents with   Chest Pain    Amanda Elliott is a 36 y.o. female.  The history is provided by the patient and medical records. No language interpreter was used.  Chest Pain Pain location:  Substernal area Pain quality: burning, dull and sharp   Pain quality: not radiating and not tearing   Pain radiates to:  Does not radiate Pain severity:  Moderate Onset quality:  Gradual Duration:  1 week Timing:  Intermittent Progression:  Waxing and waning Chronicity:  New Relieved by:  Nothing Worsened by:  Nothing Ineffective treatments:  None tried Associated symptoms: near-syncope, palpitations and shortness of breath   Associated symptoms: no abdominal pain, no anxiety, no back pain, no cough, no diaphoresis, no dizziness, no fatigue, no fever, no headache, no lower extremity edema, no nausea, no vomiting and no weakness   Risk factors: no coronary artery disease and no prior DVT/PE       Home Medications Prior to Admission medications   Medication Sig Start Date End Date Taking? Authorizing Provider  benzocaine -Menthol  (DERMOPLAST) 20-0.5 % AERO Apply 1 application topically as needed for irritation (perineal discomfort). Patient not taking: Reported on 06/24/2019 03/09/19   Paul, Daniela C, CNM  coconut oil OIL Apply 1 application topically as needed. Patient not taking: Reported on 06/24/2019 03/09/19   Paul, Daniela C, CNM  Docosahexaenoic Acid (DHA PO) Take 2 capsules by mouth daily.    [provider]  ibuprofen  (ADVIL ) 600 MG tablet Take 1 tablet (600 mg total) by mouth every 6 (six) hours. 03/08/19   Sigmon, Hoy BROCKS, CNM  iron  polysaccharides (NIFEREX) 150 MG capsule Take 1 capsule (150 mg total) by mouth daily. Patient not taking: Reported on 06/24/2019 03/08/19   Sigmon, Meredith C, CNM  magnesium  oxide  (MAG-OX) 400 (241.3 Mg) MG tablet Take 1 tablet (400 mg total) by mouth daily. Patient not taking: Reported on 06/24/2019 03/08/19   Sigmon, Meredith C, CNM  predniSONE  (DELTASONE ) 50 MG tablet Take 1 tablet (50 mg total) by mouth daily. 06/24/19   Landy Honora CROME, PA-C  Prenatal Vit-Fe Fumarate-FA (PRENATAL MULTIVITAMIN) TABS tablet Take 1 tablet by mouth daily at 12 noon.    [provider]  Probiotic CAPS Take 1 capsule by mouth daily.    [provider]      Allergies    Vicodin [hydrocodone-acetaminophen]    Review of Systems   Review of Systems  Constitutional:  Negative for chills, diaphoresis, fatigue and fever.  HENT:  Negative for congestion.   Eyes:  Negative for visual disturbance.  Respiratory:  Positive for shortness of breath. Negative for cough, chest tightness and wheezing.   Cardiovascular:  Positive for chest pain, palpitations and near-syncope. Negative for leg swelling.  Gastrointestinal:  Negative for abdominal pain, constipation, diarrhea, nausea and vomiting.  Genitourinary:  Negative for dysuria and flank pain.  Musculoskeletal:  Negative for back pain, neck pain and neck stiffness.  Skin:  Negative for rash and wound.  Neurological:  Positive for light-headedness. Negative for dizziness, syncope, weakness and headaches.  Psychiatric/Behavioral:  Negative for agitation and confusion.   All other systems reviewed and are negative.  Physical Exam Updated Vital Signs BP (!) 150/101 (BP Location: Right Arm)   Pulse 99   Temp 97.9 F (36.6 C) (Oral)   Resp 18  Ht 5' 2 (1.575 m)   Wt 53.1 kg   LMP 06/05/2021   SpO2 100%   BMI 21.40 kg/m  Physical Exam Vitals and nursing note reviewed.  Constitutional:      General: She is not in acute distress.    Appearance: She is well-developed. She is not ill-appearing, toxic-appearing or diaphoretic.  HENT:     Head: Normocephalic and atraumatic.  Eyes:     Extraocular Movements: Extraocular  movements intact.     Conjunctiva/sclera: Conjunctivae normal.     Pupils: Pupils are equal, round, and reactive to light.  Cardiovascular:     Rate and Rhythm: Normal rate and regular rhythm. Extrasystoles are present.    Heart sounds: Normal heart sounds. No murmur heard. Pulmonary:     Effort: Pulmonary effort is normal. No respiratory distress.     Breath sounds: Normal breath sounds. No decreased breath sounds, wheezing, rhonchi or rales.  Chest:     Chest wall: No tenderness.  Abdominal:     Palpations: Abdomen is soft.     Tenderness: There is no abdominal tenderness.  Musculoskeletal:        General: No swelling.     Cervical back: Neck supple.     Right lower leg: No tenderness. No edema.     Left lower leg: No tenderness. No edema.  Skin:    General: Skin is warm and dry.     Capillary Refill: Capillary refill takes less than 2 seconds.     Findings: No erythema.  Neurological:     General: No focal deficit present.     Mental Status: She is alert.  Psychiatric:        Mood and Affect: Mood normal.    ED Results / Procedures / Treatments   Labs (all labs ordered are listed, but only abnormal results are displayed) Labs Reviewed  BASIC METABOLIC PANEL  CBC  TSH  HEPATIC FUNCTION PANEL  LIPASE, BLOOD  MAGNESIUM   D-DIMER, QUANTITATIVE  I-STAT BETA HCG BLOOD, ED ([facility], [facility], [facility] ONLY)  TROPONIN I (HIGH SENSITIVITY)  TROPONIN I (HIGH SENSITIVITY)    EKG EKG Interpretation  Date/Time:  Wednesday Jun 20 2021 11:23:15 EDT Ventricular Rate:  94 PR Interval:  137 QRS Duration: 74 QT Interval:  338 QTC Calculation: 423 R Axis:   83 Text Interpretation: Sinus rhythm Ventricular premature complex Baseline wander in lead(s) V5 when compared to prior, similar appeance with new PVC. N o STEMI Confirmed by Ginger Barefoot ([id number]) on 06/20/2021 3:12:44 PM  Radiology DG Chest 2 View  Result Date: 06/20/2021 CLINICAL DATA:  Chest pain. EXAM: CHEST - 2 VIEW COMPARISON:  April 24, 2019. FINDINGS: The heart size and mediastinal contours are within normal limits. Both lungs are clear. The visualized skeletal structures are unremarkable. IMPRESSION: No active cardiopulmonary disease. Electronically Signed   By: Lynwood Landy Raddle M.D.   On: 06/20/2021 12:44    Procedures Procedures    Medications Ordered in ED Medications - No data to display  ED Course/ Medical Decision Making/ A&P                           Medical Decision Making Amount and/or Complexity of Data Reviewed Labs: ordered. Radiology: ordered.    EDDA OREA is a 36 y.o. female with no significant past medical history who presents with 1 week of waxing waning chest pain with palpitations.  According to patient, she describes her discomfort  as tightness and pressure and occasional shortness in her chest as well as burning.  She reports she has not found anything seem to make it better or worse other than some anxiety making it worse.  She denies it is not pleuritic or exertional.  She denies any recent trauma.  She denies fevers, chills, J[place], or cough.  She does report having near syncope and lightheadedness at times.  She reports no nausea, vomiting, diaphoresis, constipation, diarrhea, urinary changes.  She does report that her mother had a pulmonary embolism when she was around 36 years old.  Patient denies any leg pain or leg swelling and denies any personal history of DVT or PE.  She reports her grandfather also had early heart disease.  She reports the pain does not radiate and stays in her central chest.  She reports that she has made some dietary changes over the last few weeks but think she is staying hydrated.  She denies any other complaints.  Describes the pain as 7 out of 10 in severity when she is feeling but is not complaining of symptoms now.  On exam, lungs clear and chest nontender.  I cannot reproduce her discomfort.  No murmur.  Good pulses in all extremities.  Legs are nontender  and nonedematous.  Patient was slightly tachycardic during my initial evaluation and initial EKG shows no STEMI but does show a PVC.  Clinically I suspect she is having symptomatic palpitations that may be due to recent dietary changes and dehydration leading to the lightheadedness episodes.  I do suspect that her anxiety and stress with family history has also exacerbated her symptoms.  We will get screening labs including a D-dimer as her family history is significant as well as she was tachycardic and I evaluated her.  We will get delta troponin and other labs.  Given the burning sensation across her lower chest we will add on hepatic function and lipase to the labs in triage.  If work-up is reassuring, dissipate discharge in cardiology follow-up given the palpitations and PVCs on EKG and telemetry.  5:56 PM Work-up returned overall reassuring.  Troponin negative x2.  D-dimer negative.  Chest x-ray reassuring.  Other labs all reassuring.  Patient had several PVCs on telemetry and I suspect she is having some symptomatic palpitations.  Given her stability for over 6 and half hours with no further symptoms and no complaints I do feel she safe her discharge home.  With the PVCs and palpitations we do feel is reasonable have her follow-up with outpatient cardiology and she agrees.  She understands to maintain hydration and rest.  Patient was able to tolerate p.o. and on my reassessment did not have hypotension.  Patient agrees with plan of care and will be discharged for outpatient follow-up.        Final Clinical Impression(s) / ED Diagnoses Final diagnoses:  Atypical chest pain  Palpitations  PVC's (premature ventricular contractions)    Rx / DC Orders ED Discharge Orders          Ordered    Ambulatory referral to Cardiology       Comments: If you have not heard from the Cardiology office within the next 72 hours please call [phone].   06/20/21 1759            Clinical  Impression: 1. Atypical chest pain   2. Palpitations   3. PVC's (premature ventricular contractions)     Disposition: Discharge  Condition: Good  I have discussed  the results, Dx and Tx plan with the pt(& family if present). He/she/they expressed understanding and agree(s) with the plan. Discharge instructions discussed at great length. Strict return precautions discussed and pt &/or family have verbalized understanding of the instructions. No further questions at time of discharge.    New Prescriptions   No medications on file    Follow Up: [facility] [address] [place]  [id number] [phone]    [facility] [address] [id number] [facility] [place]  [id number] [phone]    [facility]YSON ED[facility] [address] [place] [address] [phone]        Kindra Bickham, Lonni PARAS, MD 06/20/21 [phone]

## 2021-06-26 ENCOUNTER — Encounter: Payer: Self-pay | Admitting: Cardiovascular Disease

## 2021-06-26 ENCOUNTER — Ambulatory Visit (INDEPENDENT_AMBULATORY_CARE_PROVIDER_SITE_OTHER): Payer: No Typology Code available for payment source

## 2021-06-26 ENCOUNTER — Ambulatory Visit: Payer: No Typology Code available for payment source | Admitting: Cardiovascular Disease

## 2021-06-26 DIAGNOSIS — R0789 Other chest pain: Secondary | ICD-10-CM

## 2021-06-26 DIAGNOSIS — R002 Palpitations: Secondary | ICD-10-CM

## 2021-06-26 NOTE — Assessment & Plan Note (Signed)
Amanda Elliott was recently seen in the ER on 06/20/2021 with atypical chest pain.  She had the symptoms for a week and a half prior to presenting.  Her work-up was unremarkable.  EKG showed no acute changes.  Enzymes were negative.  The pain has subsequently resolved.  She has no cardiac risk factors.  I am going to get a coronary calcium score to further evaluate. ?

## 2021-06-26 NOTE — Progress Notes (Signed)
? ? ? ?06/26/2021 ?Amanda Elliott   ?Jan 24, 1986  ?[id number] ? ?Primary Physician Juliene Pina, CNM ?Primary Cardiologist: Lorretta Harp MD Lupe Carney, [place] ? ?HPI:  Amanda Elliott is a 36 y.o. thin and fit appearing married Caucasian female mother of 1 son who works in Art therapist and was referred by the ER where she recently went for atypical chest pain and palpitations.  She has no cardiac risk factors.  There is no family history of heart disease.  She is never had a heart attack or stroke.  She is very active, lifts weights and walks and does Pilates.  Over the week and a half prior to 06/20/2021 when she went to the emergency room she was experiencing atypical chest pain along with palpitations.  Her work-up in the ER was unremarkable. ? ? ?Current Meds  ?Medication Sig  ? benzocaine-Menthol (DERMOPLAST) 20-0.5 % AERO Apply 1 application topically as needed for irritation (perineal discomfort).  ? Docosahexaenoic Acid (DHA PO) Take 2 capsules by mouth daily.  ? ibuprofen (ADVIL) 600 MG tablet Take 1 tablet (600 mg total) by mouth every 6 (six) hours.  ? magnesium oxide (MAG-OX) 400 (241.3 Mg) MG tablet Take 1 tablet (400 mg total) by mouth daily.  ? Omega-3 Fatty Acids (OMEGA-3 FISH OIL PO) Take 1 mg by mouth daily.  ? Probiotic CAPS Take 1 capsule by mouth daily.  ? [DISCONTINUED] coconut oil OIL Apply 1 application topically as needed.  ? [DISCONTINUED] iron polysaccharides (NIFEREX) 150 MG capsule Take 1 capsule (150 mg total) by mouth daily.  ? [DISCONTINUED] predniSONE (DELTASONE) 50 MG tablet Take 1 tablet (50 mg total) by mouth daily.  ? [DISCONTINUED] Prenatal Vit-Fe Fumarate-FA (PRENATAL MULTIVITAMIN) TABS tablet Take 1 tablet by mouth daily at 12 noon.  ?  ? ?Allergies  ?Allergen Reactions  ? Vicodin [Hydrocodone-Acetaminophen] Nausea Only and Swelling  ?  Pt states that she can tolerate acetaminophen  ? ? ?Social History  ? ?Socioeconomic History  ? Marital status: Married   ?  Spouse name: Not on file  ? Number of children: Not on file  ? Years of education: Not on file  ? Highest education level: Not on file  ?Occupational History  ? Not on file  ?Tobacco Use  ? Smoking status: Never  ? Smokeless tobacco: Never  ?Substance and Sexual Activity  ? Alcohol use: Never  ? Drug use: Never  ? Sexual activity: Not on file  ?Other Topics Concern  ? Not on file  ?Social History Narrative  ? Not on file  ? ?Social Determinants of Health  ? ?Financial Resource Strain: Not on file  ?Food Insecurity: Not on file  ?Transportation Needs: Not on file  ?Physical Activity: Not on file  ?Stress: Not on file  ?Social Connections: Not on file  ?Intimate Partner Violence: Not on file  ?  ? ?Review of Systems: ?General: negative for chills, fever, night sweats or weight changes.  ?Cardiovascular: negative for chest pain, dyspnea on exertion, edema, orthopnea, palpitations, paroxysmal nocturnal dyspnea or shortness of breath ?Dermatological: negative for rash ?Respiratory: negative for cough or wheezing ?Urologic: negative for hematuria ?Abdominal: negative for nausea, vomiting, diarrhea, bright red blood per rectum, melena, or hematemesis ?Neurologic: negative for visual changes, syncope, or dizziness ?All other systems reviewed and are otherwise negative except as noted above. ? ? ? ?Blood pressure 112/68, pulse 95, height 5\' 1"  (1.549 m), weight 120 lb (54.4 kg), last menstrual period 06/05/2021, unknown if  currently breastfeeding.  ?General appearance: alert and no distress ?Neck: no adenopathy, no carotid bruit, no JVD, supple, symmetrical, trachea midline, and thyroid not enlarged, symmetric, no tenderness/mass/nodules ?Lungs: clear to auscultation bilaterally ?Heart: regular rate and rhythm, S1, S2 normal, no murmur, click, rub or gallop ?Extremities: extremities normal, atraumatic, no cyanosis or edema ?Pulses: 2+ and symmetric ?Skin: Skin color, texture, turgor normal. No rashes or  lesions ?Neurologic: Grossly normal ? ?EKG sinus rhythm at 95 without ST or T wave changes.  Personally reviewed this EKG. ? ?ASSESSMENT AND PLAN:  ? ?Atypical chest pain ?Ms. Hutchins was recently seen in the ER on 06/20/2021 with atypical chest pain.  She had the symptoms for a week and a half prior to presenting.  Her work-up was unremarkable.  EKG showed no acute changes.  Enzymes were negative.  The pain has subsequently resolved.  She has no cardiac risk factors.  I am going to get a coronary calcium score to further evaluate. ? ?Palpitations ?History of palpitations recently over the last week and a half along with atypical chest pain.  She does admit to having a lot of "anxiety".  I am going to get a 2-week Zio patch to further evaluate. ? ? ? ? ?Lorretta Harp MD FACP,FACC,FAHA, FSCAI ?06/26/2021 ?3:13 PM ?

## 2021-06-26 NOTE — Progress Notes (Unsigned)
Enrolled for [employer] to mail a ZIO XT long term holter monitor to the patients address on file.  

## 2021-06-26 NOTE — Assessment & Plan Note (Signed)
History of palpitations recently over the last week and a half along with atypical chest pain.  She does admit to having a lot of "anxiety".  I am going to get a 2-week Zio patch to further evaluate. ?

## 2021-06-26 NOTE — Patient Instructions (Signed)
 Medication Instructions:  Your physician recommends that you continue on your current medications as directed. Please refer to the Current Medication list given to you today.  *If you need a refill on your cardiac medications before your next appointment, please call your pharmacy*   Testing/Procedures: Your physician has requested that you have an echocardiogram. Echocardiography is a painless test that uses sound waves to create images of your heart. It provides your doctor with information about the size and shape of your heart and how well your heart's chambers and valves are working. This procedure takes approximately one hour. There are no restrictions for this procedure. This procedure will be done at [address]   Dr. Court has ordered a CT coronary calcium score.   Test locations:  [facility] ([address]rd Floor [place], [place] [address]) [facility] ([address] [place], [place] [address])   This is $99 out of pocket.   Coronary CalciumScan A coronary calcium scan is an imaging test used to look for deposits of calcium and other fatty materials (plaques) in the inner lining of the blood vessels of the heart (coronary arteries). These deposits of calcium and plaques can partly clog and narrow the coronary arteries without producing any symptoms or warning signs. This puts a person at risk for a heart attack. This test can detect these deposits before symptoms develop. Tell a health care provider about: Any allergies you have. All medicines you are taking, including vitamins, herbs, eye drops, creams, and over-the-counter medicines. Any problems you or family members have had with anesthetic medicines. Any blood disorders you have. Any surgeries you have had. Any medical conditions you have. Whether you are pregnant or may be pregnant. What are the risks? Generally, this is a safe procedure. However, problems may occur, including: Harm to a  pregnant woman and her unborn baby. This test involves the use of radiation. Radiation exposure can be dangerous to a pregnant woman and her unborn baby. If you are pregnant, you generally should not have this procedure done. Slight increase in the risk of cancer. This is because of the radiation involved in the test. What happens before the procedure? No preparation is needed for this procedure. What happens during the procedure? You will undress and remove any jewelry around your neck or chest. You will put on a hospital gown. Sticky electrodes will be placed on your chest. The electrodes will be connected to an electrocardiogram (ECG) machine to record a tracing of the electrical activity of your heart. A CT scanner will take pictures of your heart. During this time, you will be asked to lie still and hold your breath for 2-3 seconds while a picture of your heart is being taken. The procedure may vary among health care providers and hospitals. What happens after the procedure? You can get dressed. You can return to your normal activities. It is up to you to get the results of your test. Ask your health care provider, or the department that is doing the test, when your results will be ready. Summary A coronary calcium scan is an imaging test used to look for deposits of calcium and other fatty materials (plaques) in the inner lining of the blood vessels of the heart (coronary arteries). Generally, this is a safe procedure. Tell your health care provider if you are pregnant or may be pregnant. No preparation is needed for this procedure. A CT scanner will take pictures of your heart. You can return  to your normal activities after the scan is done. This information is not intended to replace advice given to you by your health care provider. Make sure you discuss any questions you have with your health care provider. Document Released: 08/03/2007 Document Revised: 12/25/2015 Document Reviewed:  12/25/2015 Elsevier Interactive Patient Education  2017 Elsevier Inc.   Amanda Elliott- Long Term Monitor Instructions  Your physician has requested you wear a ZIO patch monitor for 14 days.  This is a single patch monitor. [employer] supplies one patch monitor per enrollment. Additional stickers are not available. Please do not apply patch if you will be having a Nuclear Stress Test,  Echocardiogram, Cardiac CT, MRI, or Chest Xray during the period you would be wearing the  monitor. The patch cannot be worn during these tests. You cannot remove and re-apply the  ZIO XT patch monitor.  Your ZIO patch monitor will be mailed 3 day USPS to your address on file. It may take 3-5 days  to receive your monitor after you have been enrolled.  Once you have received your monitor, please review the enclosed instructions. Your monitor  has already been registered assigning a specific monitor serial # to you.  Billing and Patient Assistance Program Information  We have supplied [employer] with any of your insurance information on file for billing purposes. [employer] offers a sliding scale Patient Assistance Program for patients that do not have  insurance, or whose insurance does not completely cover the cost of the ZIO monitor.  You must apply for the Patient Assistance Program to qualify for this discounted rate.  To apply, please call [employer] at [phone], select option 4, select option 2, ask to apply for  Patient Assistance Program. Amanda Elliott will ask your household income, and how many people  are in your household. They will quote your out-of-pocket cost based on that information.  [employer] will also be able to set up a 50-month, interest-free payment plan if needed.  Applying the monitor   Shave hair from upper left chest.  Hold abrader disc by orange tab. Rub abrader in 40 strokes over the upper left chest as  indicated in your monitor instructions.  Clean area with 4 enclosed alcohol pads. Let dry.   Apply patch as indicated in monitor instructions. Patch will be placed under collarbone on left  side of chest with arrow pointing upward.  Rub patch adhesive wings for 2 minutes. Remove white label marked 1. Remove the white  label marked 2. Rub patch adhesive wings for 2 additional minutes.  While looking in a mirror, press and release button in center of patch. A small green light will  flash 3-4 times. This will be your only indicator that the monitor has been turned on.  Do not shower for the first 24 hours. You may shower after the first 24 hours.  Press the button if you feel a symptom. You will hear a small click. Record Date, Time and  Symptom in the Patient Logbook.  When you are ready to remove the patch, follow instructions on the last 2 pages of Patient  Logbook. Stick patch monitor onto the last page of Patient Logbook.  Place Patient Logbook in the blue and white box. Use locking tab on box and tape box closed  securely. The blue and white box has prepaid postage on it. Please place it in the mailbox as  soon as possible. Your physician should have your test results approximately 7 days after the  monitor has  been mailed back to [place].  Call [facility] Customer Care at [phone] if you have questions regarding  your ZIO XT patch monitor. Call them immediately if you see an orange light blinking on your  monitor.  If your monitor falls off in less than 4 days, contact our Monitor department at [phone].  If your monitor becomes loose or falls off after 4 days call [employer] at [phone] for  suggestions on securing your monitor   Follow-Up: At [facility], you and your health needs are our priority.  As part of our continuing mission to provide you with exceptional heart care, we have created designated Provider Care Teams.  These Care Teams include your primary Cardiologist (physician) and Advanced Practice Providers (APPs -  Physician  Assistants and Nurse Practitioners) who all work together to provide you with the care you need, when you need it.  We recommend signing up for the patient portal called MyChart.  Sign up information is provided on this After Visit Summary.  MyChart is used to connect with patients for Virtual Visits (Telemedicine).  Patients are able to view lab/test results, encounter notes, upcoming appointments, etc.  Non-urgent messages can be sent to your provider as well.   To learn more about what you can do with MyChart, go to [email or web address].    Your next appointment:   4-6 week(s)  The format for your next appointment:   In Person  Provider:   Dorn Lesches, MD

## 2021-07-01 DIAGNOSIS — R002 Palpitations: Secondary | ICD-10-CM

## 2021-07-01 DIAGNOSIS — R0789 Other chest pain: Secondary | ICD-10-CM

## 2021-07-13 ENCOUNTER — Ambulatory Visit
Admission: RE | Admit: 2021-07-13 | Discharge: 2021-07-13 | Disposition: A | Discharge status: Home / Self Care | Payer: Self-pay | Source: Ambulatory Visit | Attending: Cardiovascular Disease | Admitting: Cardiovascular Disease

## 2021-07-13 ENCOUNTER — Ambulatory Visit (HOSPITAL_COMMUNITY): Payer: No Typology Code available for payment source | Attending: Internal Medicine

## 2021-07-13 DIAGNOSIS — R0789 Other chest pain: Secondary | ICD-10-CM

## 2021-07-13 DIAGNOSIS — R002 Palpitations: Secondary | ICD-10-CM | POA: Diagnosis not present

## 2021-07-13 LAB — ECHOCARDIOGRAM COMPLETE
Area-P 1/2: 3.74 cm2
Calc EF: 56.6 %
S' Lateral: 3.1 cm
Single Plane A2C EF: 56.5 %
Single Plane A4C EF: 57 %

## 2021-08-15 ENCOUNTER — Ambulatory Visit: Payer: No Typology Code available for payment source | Admitting: Cardiovascular Disease

## 2021-10-01 ENCOUNTER — Encounter: Payer: Self-pay | Admitting: Cardiovascular Disease

## 2021-10-01 ENCOUNTER — Ambulatory Visit: Payer: No Typology Code available for payment source | Admitting: Cardiovascular Disease

## 2021-10-01 VITALS — BP 102/72 | HR 82 | Ht 62.0 in | Wt 124.0 lb

## 2021-10-01 DIAGNOSIS — R0789 Other chest pain: Secondary | ICD-10-CM | POA: Diagnosis not present

## 2021-10-01 NOTE — Patient Instructions (Signed)
Medication Instructions:  No changes *If you need a refill on your cardiac medications before your next appointment, please call your pharmacy*   Lab Work: None ordered If you have labs (blood work) drawn today and your tests are completely normal, you will receive your results only by: MyChart Message (if you have MyChart) OR A paper copy in the mail If you have any lab test that is abnormal or we need to change your treatment, we will call you to review the results.   Testing/Procedures: None ordered   Follow-Up: At [facility], you and your health needs are our priority.  As part of our continuing mission to provide you with exceptional heart care, we have created designated Provider Care Teams.  These Care Teams include your primary Cardiologist (physician) and Advanced Practice Providers (APPs -  Physician Assistants and Nurse Practitioners) who all work together to provide you with the care you need, when you need it.  We recommend signing up for the patient portal called "MyChart".  Sign up information is provided on this After Visit Summary.  MyChart is used to connect with patients for Virtual Visits (Telemedicine).  Patients are able to view lab/test results, encounter notes, upcoming appointments, etc.  Non-urgent messages can be sent to your provider as well.   To learn more about what you can do with MyChart, go to [email or web address].    Your next appointment:   Follow up as needed with Dr. Berry   Important Information About Sugar       

## 2021-10-01 NOTE — Progress Notes (Signed)
[name] returns for follow-up of her outpatient test performed in the evaluation of palpitations and atypical chest pain.  She no longer has any symptoms.  A coronary calcium score performed 07/13/2021 was 0, 2D echo was normal and an event monitor showed only occasional PVCs.  I have reassured her that there was nothing that was discovered on extensive testing and given her subsequent lack of symptoms I do not think further evaluation is warranted.  I will see her back as needed.  Runell Gess, M.D., FACP, [facility], [facility] [address] [place], [place]  [address]  [phone] 10/01/2021 10:33 AM

## 2022-04-24 ENCOUNTER — Other Ambulatory Visit: Payer: Self-pay

## 2022-04-24 DIAGNOSIS — Z363 Encounter for antenatal screening for malformations: Secondary | ICD-10-CM

## 2022-04-24 DIAGNOSIS — Z3A12 12 weeks gestation of pregnancy: Secondary | ICD-10-CM

## 2022-04-25 ENCOUNTER — Other Ambulatory Visit: Payer: Self-pay

## 2022-04-25 ENCOUNTER — Ambulatory Visit: Payer: No Typology Code available for payment source

## 2022-04-25 VITALS — BP 124/66 | HR 93

## 2022-04-25 DIAGNOSIS — Z3A12 12 weeks gestation of pregnancy: Secondary | ICD-10-CM

## 2022-04-25 DIAGNOSIS — O09521 Supervision of elderly multigravida, first trimester: Secondary | ICD-10-CM

## 2022-04-25 DIAGNOSIS — Z363 Encounter for antenatal screening for malformations: Secondary | ICD-10-CM

## 2022-04-25 DIAGNOSIS — O358XX0 Maternal care for other (suspected) fetal abnormality and damage, not applicable or unspecified: Secondary | ICD-10-CM

## 2022-04-25 DIAGNOSIS — O09811 Supervision of pregnancy resulting from assisted reproductive technology, first trimester: Secondary | ICD-10-CM | POA: Diagnosis not present

## 2022-04-25 DIAGNOSIS — D181 Lymphangioma, any site: Secondary | ICD-10-CM | POA: Insufficient documentation

## 2022-04-25 DIAGNOSIS — O09522 Supervision of elderly multigravida, second trimester: Secondary | ICD-10-CM | POA: Diagnosis not present

## 2022-04-25 DIAGNOSIS — O289 Unspecified abnormal findings on antenatal screening of mother: Secondary | ICD-10-CM | POA: Diagnosis present

## 2022-04-25 DIAGNOSIS — O285 Abnormal chromosomal and genetic finding on antenatal screening of mother: Secondary | ICD-10-CM

## 2022-04-25 DIAGNOSIS — O3513X0 Maternal care for (suspected) chromosomal abnormality in fetus, trisomy 21, not applicable or unspecified: Secondary | ICD-10-CM

## 2022-04-25 DIAGNOSIS — O09813 Supervision of pregnancy resulting from assisted reproductive technology, third trimester: Secondary | ICD-10-CM | POA: Diagnosis not present

## 2022-04-26 ENCOUNTER — Telehealth: Payer: Self-pay | Admitting: *Deleted

## 2022-04-26 NOTE — Telephone Encounter (Signed)
See f/u phone call note.

## 2022-04-26 NOTE — Progress Notes (Cosign Needed Addendum)
[facility] at [facility] [address] Phone:  [phone]   Fax:  [phone]    Name: Amanda Elliott Indication: Down syndrome suspected in the current pregnancy  DOB: November 07, 1985 Age: 37 y.o.   EDD: 11/04/2022 LMP: 01/28/2022 Referring Provider:  Juliene Pina, CNM  EGA: [id number] Counselor: AStaci Righter MS, CGC  OB Hx:[id number] of Appointment: 04/25/2022  Accompanied by: UKristine LineaFace to Face Time: 472Minutes   Genetic Counseling: Given the nature of the genetic counseling session we did not discuss Amanda Elliott's medical or family history in detail. The medical and family history can be completed at a future genetic counseling appointment if desired.  Down syndrome suspected in the current pregnancy. LLetinapreviously completed cell-free DNA screening (cfDNA). This screen analyzes cell-free DNA originating from the placenta that is found in the maternal blood circulation during pregnancy. This test can provide information regarding the presence or absence of extra fetal (placental) DNA for chromosomes 13, 18 and 21 as well as the sex chromosomes. LMalaniewas referred for genetic counseling as results from her cfDNA indicate an increased risk for Trisomy 264(Down syndrome).  Possible prenatal ultrasound findings associated with Down syndrome include increased nuchal translucency/cystic hygroma, absent nasal bone, ventriculomegaly, heart defects, an echogenic intracardiac focus, gastrointestinal abnormalities such as duodenal atresia, pyelectasis, shortened long bones, and an increased space between the first and second toes. Approximately 50% of pregnancies with Down syndrome do not demonstrate signs of the condition on prenatal ultrasound. Postnatally, all individuals with Down syndrome have intellectual disability and developmental delays, though a wide spectrum of ability is noted. Behavioral/emotional problems  that are possible include anxiety, depression, ADHD, and autism. The degree of intellectual disability/developmental delays and presence of behavioral/emotional problems an individual with Down syndrome will experience cannot be predicted prenatally and early intervention/special education are instrumental for the achievement of developmental milestones after birth. Several craniofacial differences are common in individuals with Down syndrome, including a flattened facial profile, almond-shaped upward-slanting eyes, a small mouth, protruding tongue, small ears, and microcephaly. Other physical features may include a short neck, small hands and feet, fifth finger clinodactyly, and a single palmar crease. Approximately 60% of affected individuals have vision problems, including cataracts, strabismus, and glaucoma among other vision abnormalities. Ear infections, respiratory infections, and obstructive sleep apnea are common, and 40-75% of individuals experience hearing loss. Half of all individuals with Down syndrome have heart defects, with atrioventricular septal defects being the most common. Musculoskeletal features may include hypotonia, short stature, joint laxity, decreased bone mass with an increased risk of fractures, hip dislocations, and atlantoaxial instability. Gastrointestinal problems including duodenal atresia, small bowel stenosis, annular pancreas, imperforate anus, Hirschsprung disease, reflux, constipation, diarrhea, and Celiac disease affect 10-15% of individuals with Down syndrome. Individuals may also experience thyroid dysfunction and delayed puberty. There is also a slightly increased risk of leukemia. In regards to the potential prognosis of pregnancies with Down syndrome, up to 30% will end in miscarriage or stillbirth from 127weeks' gestation until term. Of liveborn children, 96% survive the first year of life and 90% survive the first 10 years of life. The median lifespan of individuals  with Down syndrome is in their 675s Many affected individuals participate in community sports and activities, have friends, and are active members of their families. Some adults with Down syndrome are able to care for themselves, live independently, and hold steady jobs while others depend on their  families for care and hands-on assistance with daily activities.  We discussed that Down syndrome typically results from having three copies of chromosome 21. This most often occurs due to a random error in chromosomal division during the formation of reproductive cells in a process called nondisjunction. A small percentage of cases are caused by a chromosomal rearrangement (translocation)  involving chromosome 21. A translocation in a person's genetics occurs when there is an exchange in chromosome material, such as when a segment of one chromosome breaks off and reattaches to a different chromosome. If a parent carries a balanced translocation they do not have any extra or missing genetic material. However, they would have an increased risk to pass unbalanced chromosomes to their offspring. There are several possible explanations for the high risk cfDNA result. First, the pregnancy could truly be affected by Down syndrome. Second, the pregnancy could be mosaic for Down syndrome. Mosaicism occurs when an individual has two or more genetically different sets of cells in their body. Individuals who are mosaic for Down syndrome have some cells in the body with three copies of the 21st chromosome and may have other cells that are chromosomally normal. Third, the placenta could have Down syndrome while the pregnancy could be unaffected. This is a phenomenon known as confined placental mosaicism (CPM). Lastly, this could be a false positive result. The Positive Predictive Value (PPV) is the probability that a pregnancy with a positive test result is truly affected with the condition. The PPV for Amanda Elliott's pregnancy,  calculated by the [facility], is 86%. While the PPV is 86%, given the cystic hygroma visualized on today's ultrasound, the chance that the current pregnancy has Down syndrome is probably much higher.  Jianna was counseled regarding diagnostic testing via Chorionic Villus Sampling (CVS) and amniocentesis. CVS involves the withdrawal of a small amount of chorionic villi (tissue from the developing placenta). Amniocentesis involves the removal of a small amount of amniotic fluid from the sac surrounding the pregnancy. Ultrasound guidance is used throughout both procedures. Possible procedural difficulties and complications that can arise with these procedures include maternal infection, cramping, bleeding, fluid leakage, and/or pregnancy loss. The risk for pregnancy loss with a CVS/amniocentesis is 1/500. We reviewed the various testing options that could be ordered on a CVS/amniocentesis sample, including information about what each test is looking for, turnaround time, and the benefits/limitations of each option. Specifically, we discussed the options of FISH and a fetal karyotype. Storee was informed that diagnostic testing is the only way to definitively determine if a pregnancy is affected by Down syndrome prenatally.   We discussed that if Shontel opted for diagnostic testing and results confirmed a diagnosis of Down syndrome, it could impact pregnancy management in several different ways. Some individuals may choose to terminate a pregnancy or consider adoption if a diagnosis of Down syndrome were confirmed. Termination for the indication of Down syndrome is not currently available in the state of [place], but is available in neighboring states such as [place]. For individuals who would not alter their pregnancy management regardless of testing outcomes, a prenatal diagnosis could allow for delivery planning and prenatal consults with  various specialists that would be involved in the infant's care as well as time to plan and prepare emotionally, physically, and financially. Alvaretta was also made aware that she has the option of continuing to monitor the pregnancy with routine ultrasounds and pursue genetic testing postnatally.     Patient  Plan:  Proceed with: CVS for prenatal diagnosis Informed consent was obtained. All questions were answered.    Thank you for sharing in the care of Jnaya with [place].  Please do not hesitate to contact us if you have any questions.  Staci Righter, MS, [facility]

## 2022-04-29 ENCOUNTER — Telehealth: Payer: Self-pay | Admitting: Genetics

## 2022-04-29 NOTE — Telephone Encounter (Signed)
Called Amanda Elliott to review CVS FISH results. Discussed that the CVS FISH revealed three chromosome 21 hybridization signals. Also discussed that the final karyotype is pending and we will call her with the karyotype when it is available. Finally discussed that clinical decisions should not be based solely on prenatal FISH testing. [name] verbalized understanding.

## 2022-05-06 LAB — MCC TRACKING

## 2022-05-07 ENCOUNTER — Telehealth: Payer: Self-pay | Admitting: Genetics

## 2022-05-07 NOTE — Telephone Encounter (Signed)
Called Amanda Elliott to return final CVS karyotype results. Left voicemail with [facility] call back number.

## 2022-05-08 ENCOUNTER — Telehealth: Payer: Self-pay | Admitting: Genetics

## 2022-05-08 NOTE — Telephone Encounter (Signed)
Reviewed with Jodelle the final CVS karyotype: 47,XY,+21. 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 We discussed that risk for aneuploidy in a future pregnancy would be equivalent to Ardene's age-related risk. All questions answered.

## 2022-05-16 LAB — CHROMOSOME, CHORIONIC VILLUS
Cells Analyzed: 20
Cells Counted: 20
Cells Karyotyped: 2
GTG Band Resolution: 400

## 2022-05-16 LAB — FISH, CVS PRENATAL ANEUPLOID
Cells Analyzed: 50
Cells Counted: 50

## 2022-05-16 LAB — MATERNAL CELL CONTAMINATION

## 2022-05-16 LAB — SPECIMEN STATUS REPORT

## 2022-05-16 LAB — MCC TRACKING

## 2023-12-16 IMAGING — CT CT CARDIAC CORONARY ARTERY CALCIUM SCORE
3 series · 14 of 20 positions shown, 16 images · non-contrast
Comparison: None Available.
COMPARISON: None Available.

Addendum:
EXAM:
OVER-READ INTERPRETATION  CT CHEST

The following report is a limited chest CT over-read performed by
07/13/2021. This over-read does not include interpretation of cardiac
or coronary anatomy or pathology. The coronary calcium score
interpretation by the cardiologist is attached.
CLINICAL DATA: Risk stratification: 35 Year-old White Female
Coronary Calcium Score
TECHNIQUE: The patient was scanned on a Siemens Force scanner. Axial
non-contrast 3 mm slices were carried out through the heart. The
data set was analyzed on a dedicated work station and scored using
the Agatson method.

[Series 2: cascseq 2.0 sa36 70% (id) · axial · 0.39mm/px · z∈[-242,-152]mm · 4 of 76 slices shown]
[im 16/76  vessel]
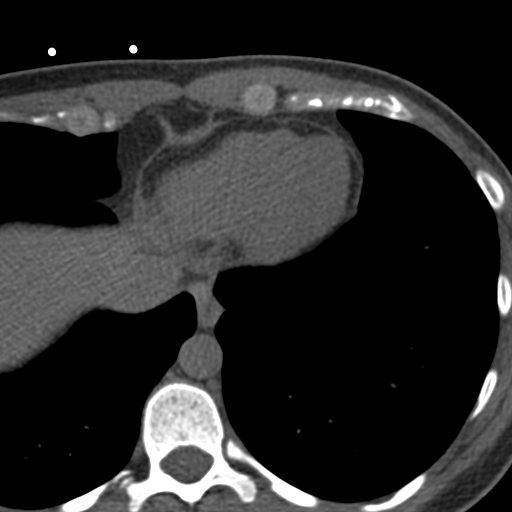
[im 31/76  vessel]
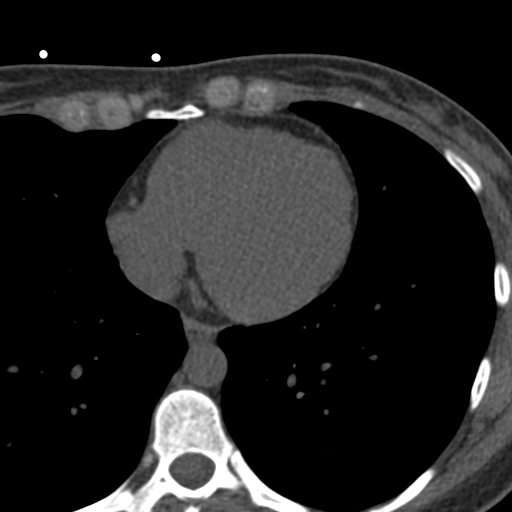
[im 46/76  vessel]
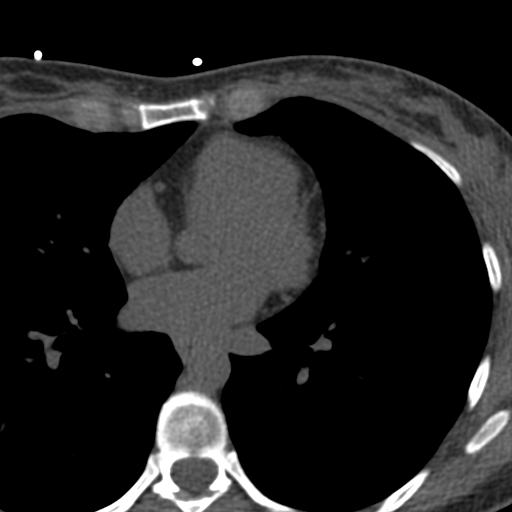
[im 61/76  vessel]
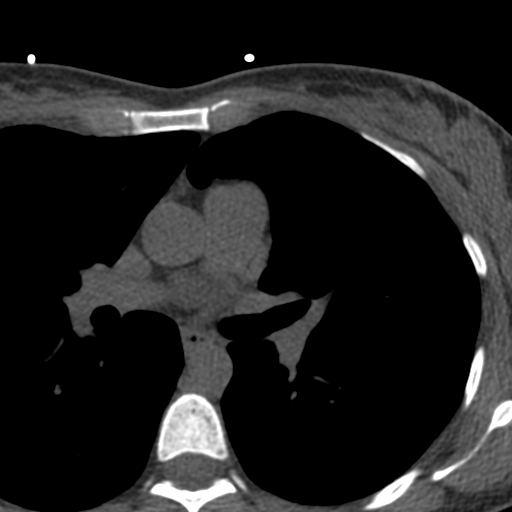

[Series 3: cascseq 2.0 bf37 st · axial · 0.67mm/px · z∈[-248,-148]mm · 5 of 76 slices shown, 7 images]
[im 13/76  vessel]
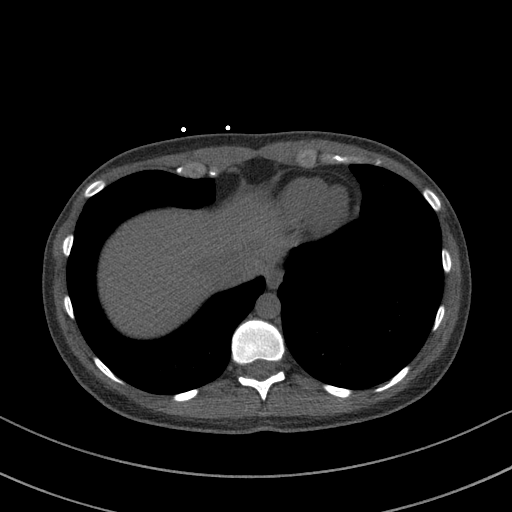
[im 13/76  lung]
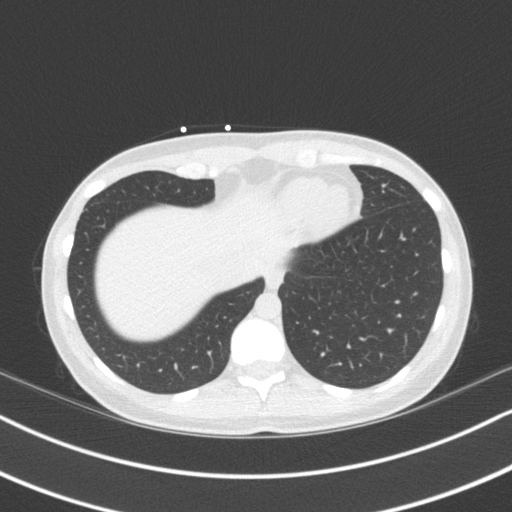
[im 26/76  vessel]
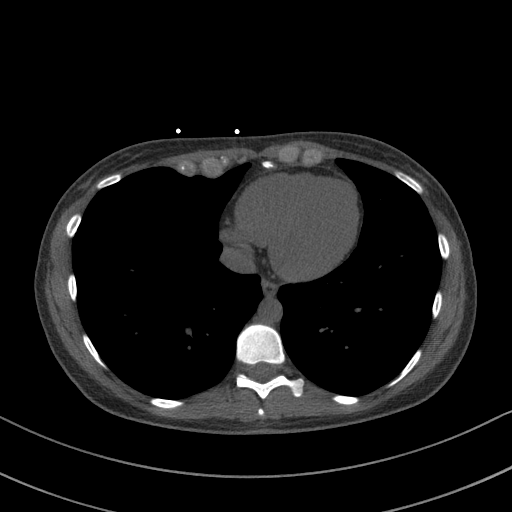
[im 38/76  vessel]
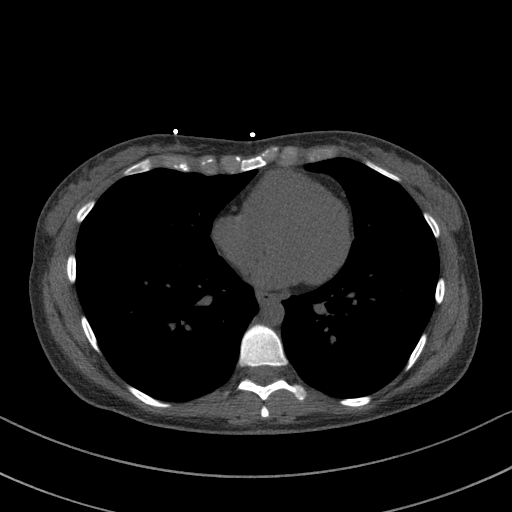
[im 51/76  vessel]
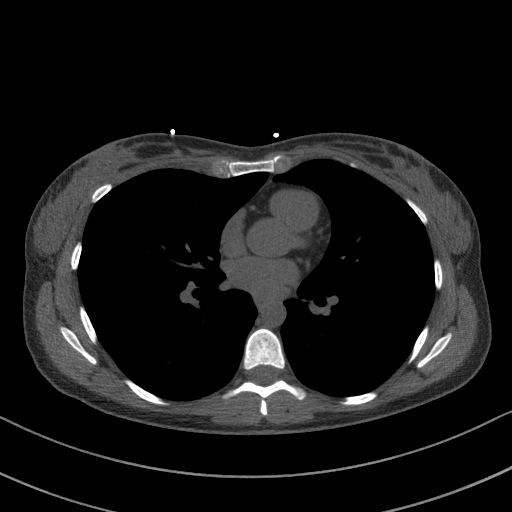
[im 63/76  vessel]
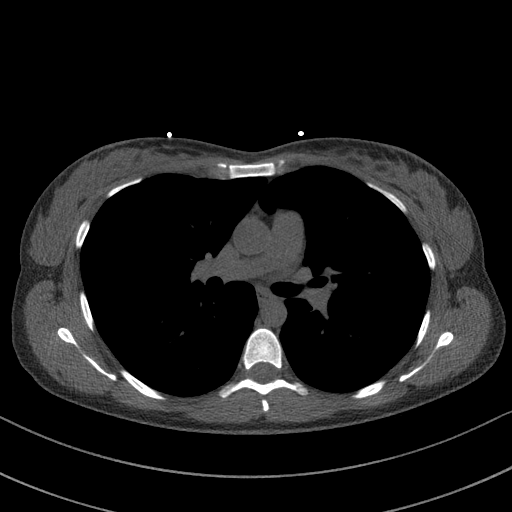
[im 63/76  lung]
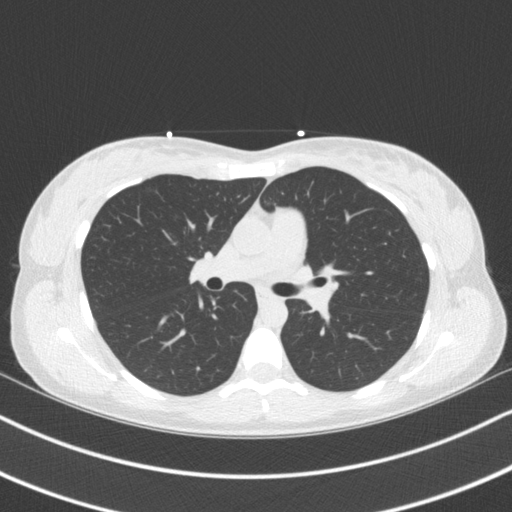

[Series 4: cascseq 2.0 br59 lung · axial · 0.67mm/px · z∈[-248,-148]mm · 5 of 76 slices shown]
[im 13/76  lung]
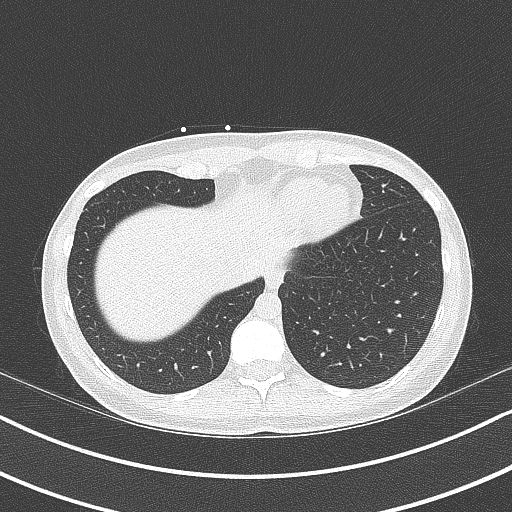
[im 26/76  lung]
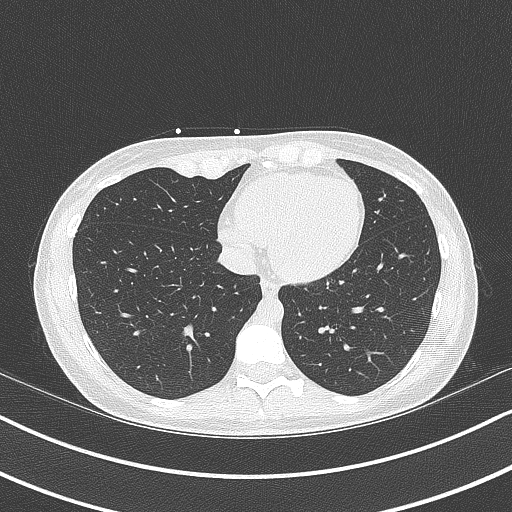
[im 38/76  lung]
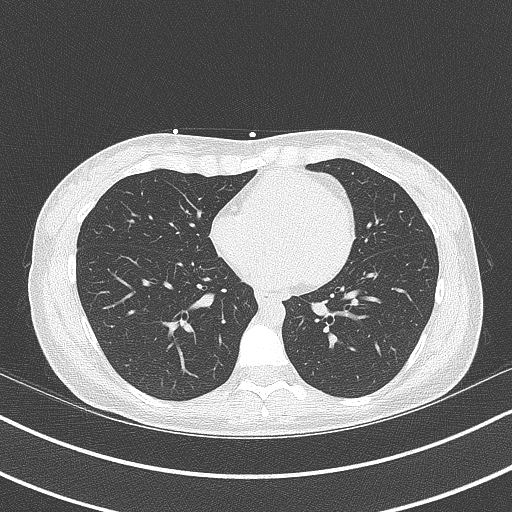
[im 51/76  lung]
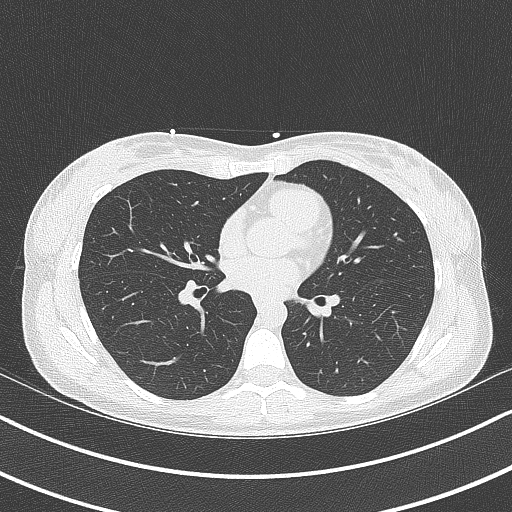
[im 63/76  lung]
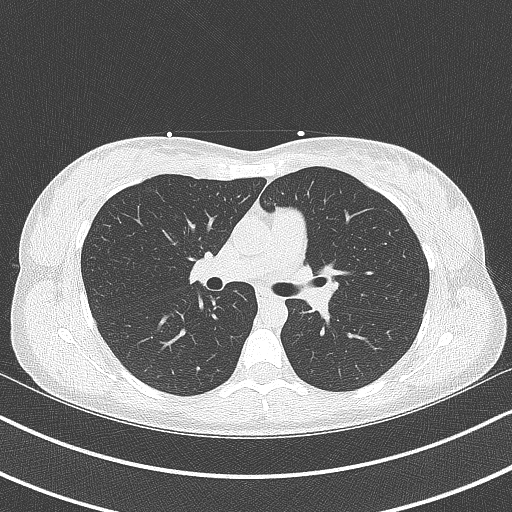

[14 of 20 positions shown; findings below may reference images not displayed]

FINDINGS: Vascular: No significant noncardiac vascular finding.

Mediastinum/Nodes: No pathologically enlarged mediastinal or hilar
lymph nodes within the visualized portions of the thorax. Distal
esophagus appears normal.

Lungs/Pleura: Within the visualized portions of the thorax there are
no suspicious pulmonary nodules or masses, no focal airspace
consolidation, and no pleural effusion or pneumothorax.

Upper Abdomen: No acute abnormality.

Musculoskeletal: No acute osseous abnormality.
IMPRESSION: No significant extracardiac findings.
FINDINGS: Non-cardiac: See separate report from [REDACTED].

Ascending Aorta: Normal caliber. No calcifications.

Pericardium: Normal.

Coronary arteries: Normal origins.

Coronary Calcium Score:

Left main: 0

Left anterior descending artery: 0

Left circumflex artery: 0

Right coronary artery: 0

Total: 0

Percentile: 1st for age, sex, and race matched control.
IMPRESSION: 1. Coronary calcium score of 0. This was 1st percentile for age,
gender, and race matched controls.

RECOMMENDATIONS:



If CAC = 0, it is reasonable to withhold statin therapy and reassess
in 5 to 10 years, as long as higher risk conditions are absent
(diabetes mellitus, family history of premature CHD in first degree
relatives (males <55 years; females <65 years), cigarette smoking,
LDL >=190 mg/dL or other independent risk factors).

If CAC is 1 to 99, it is reasonable to initiate statin therapy for
patients >=55 years of age.

If CAC is >=100 or >=75th percentile, it is reasonable to initiate
statin therapy at any age.

Cardiology referral should be considered for patients with CAC
scores =400 or >=75th percentile.

*6675 AHA/ACC/AACVPR/AAPA/ABC/NAZARETH/HIRO/JOINER/Bikane/KHALIDA/JOGINDER/CHAI
Guideline on the Management of Blood Cholesterol: A Report of the
American College of Cardiology/American Heart Association Task Force
on Clinical Practice Guidelines. J Am Coll Cardiol.
4571;73(24):7829-7487.

*** End of Addendum ***
EXAM:
OVER-READ INTERPRETATION  CT CHEST

The following report is a limited chest CT over-read performed by
07/13/2021. This over-read does not include interpretation of cardiac
or coronary anatomy or pathology. The coronary calcium score
interpretation by the cardiologist is attached.
FINDINGS: Vascular: No significant noncardiac vascular finding.

Mediastinum/Nodes: No pathologically enlarged mediastinal or hilar
lymph nodes within the visualized portions of the thorax. Distal
esophagus appears normal.

Lungs/Pleura: Within the visualized portions of the thorax there are
no suspicious pulmonary nodules or masses, no focal airspace
consolidation, and no pleural effusion or pneumothorax.

Upper Abdomen: No acute abnormality.

Musculoskeletal: No acute osseous abnormality.
IMPRESSION: No significant extracardiac findings.
